# Patient Record
Sex: Female | Born: 1960 | Race: White | Hispanic: No | Marital: Married | Smoking: Never smoker
Health system: Southern US, Community
[De-identification: ages and names within clinical notes are randomized; demographics above are authoritative.]

## PROBLEM LIST (undated history)

## (undated) DIAGNOSIS — N958 Other specified menopausal and perimenopausal disorders: Secondary | ICD-10-CM

## (undated) DIAGNOSIS — E119 Type 2 diabetes mellitus without complications: Secondary | ICD-10-CM

## (undated) DIAGNOSIS — I10 Essential (primary) hypertension: Secondary | ICD-10-CM

## (undated) DIAGNOSIS — E785 Hyperlipidemia, unspecified: Secondary | ICD-10-CM

## (undated) DIAGNOSIS — E669 Obesity, unspecified: Secondary | ICD-10-CM

## (undated) DIAGNOSIS — F32A Depression, unspecified: Secondary | ICD-10-CM

## (undated) DIAGNOSIS — J45909 Unspecified asthma, uncomplicated: Secondary | ICD-10-CM

## (undated) DIAGNOSIS — F329 Major depressive disorder, single episode, unspecified: Secondary | ICD-10-CM

## (undated) HISTORY — DX: Hyperlipidemia, unspecified: E78.5

## (undated) HISTORY — DX: Other specified menopausal and perimenopausal disorders: N95.8

## (undated) HISTORY — PX: CHOLECYSTECTOMY: SHX55

## (undated) HISTORY — DX: Obesity, unspecified: E66.9

## (undated) HISTORY — DX: Essential (primary) hypertension: I10

---

## 2003-08-13 ENCOUNTER — Other Ambulatory Visit: Admission: RE | Admit: 2003-08-13 | Discharge: 2003-08-13 | Payer: Self-pay | Admitting: Family Medicine

## 2004-08-13 ENCOUNTER — Other Ambulatory Visit: Admission: RE | Admit: 2004-08-13 | Discharge: 2004-08-13 | Payer: Self-pay | Admitting: Family Medicine

## 2004-09-14 ENCOUNTER — Ambulatory Visit: Payer: Self-pay | Admitting: Family Medicine

## 2004-10-06 ENCOUNTER — Ambulatory Visit: Payer: Self-pay | Admitting: Family Medicine

## 2007-02-21 ENCOUNTER — Ambulatory Visit: Payer: Self-pay | Admitting: Internal Medicine

## 2007-03-17 ENCOUNTER — Ambulatory Visit: Payer: Self-pay | Admitting: Gastroenterology

## 2008-03-07 ENCOUNTER — Ambulatory Visit: Payer: Self-pay | Admitting: Internal Medicine

## 2009-03-11 ENCOUNTER — Ambulatory Visit: Payer: Self-pay | Admitting: Internal Medicine

## 2010-05-07 ENCOUNTER — Ambulatory Visit: Payer: Self-pay | Admitting: Internal Medicine

## 2010-06-10 ENCOUNTER — Ambulatory Visit: Payer: Self-pay | Admitting: Internal Medicine

## 2010-06-18 ENCOUNTER — Ambulatory Visit: Payer: Self-pay | Admitting: Internal Medicine

## 2010-07-18 ENCOUNTER — Ambulatory Visit: Payer: Self-pay | Admitting: Internal Medicine

## 2011-05-25 ENCOUNTER — Ambulatory Visit: Payer: Self-pay | Admitting: Internal Medicine

## 2011-07-31 ENCOUNTER — Other Ambulatory Visit: Payer: Self-pay | Admitting: Internal Medicine

## 2011-08-01 ENCOUNTER — Other Ambulatory Visit: Payer: Self-pay | Admitting: Internal Medicine

## 2011-09-08 ENCOUNTER — Other Ambulatory Visit: Payer: Self-pay | Admitting: Internal Medicine

## 2011-09-08 MED ORDER — SERTRALINE HCL 25 MG PO TABS: 25.0000 mg | ORAL_TABLET | Freq: Every day | ORAL | Status: DC

## 2012-02-29 ENCOUNTER — Encounter: Payer: Self-pay | Admitting: Internal Medicine

## 2012-03-14 ENCOUNTER — Other Ambulatory Visit: Payer: Self-pay | Admitting: Internal Medicine

## 2012-03-14 MED ORDER — SERTRALINE HCL 25 MG PO TABS: 25.0000 mg | ORAL_TABLET | Freq: Every day | ORAL | Status: DC

## 2012-08-14 ENCOUNTER — Other Ambulatory Visit: Payer: Self-pay | Admitting: Internal Medicine

## 2012-08-16 MED ORDER — SERTRALINE HCL 25 MG PO TABS: 25.0000 mg | ORAL_TABLET | Freq: Every day | ORAL | Status: DC

## 2012-08-16 NOTE — Addendum Note (Signed)
Addended by: Sherlene Shams on: 08/16/2012 06:10 PM   Modules accepted: Orders

## 2012-08-16 NOTE — Telephone Encounter (Signed)
2nd request

## 2012-08-17 ENCOUNTER — Other Ambulatory Visit: Payer: Self-pay

## 2012-09-26 ENCOUNTER — Encounter: Payer: Self-pay | Admitting: Internal Medicine

## 2012-10-17 ENCOUNTER — Other Ambulatory Visit: Payer: Self-pay | Admitting: Internal Medicine

## 2012-11-01 ENCOUNTER — Encounter: Payer: Self-pay | Admitting: Internal Medicine

## 2012-11-03 ENCOUNTER — Encounter: Payer: Self-pay | Admitting: Internal Medicine

## 2012-11-03 ENCOUNTER — Ambulatory Visit (INDEPENDENT_AMBULATORY_CARE_PROVIDER_SITE_OTHER): Payer: BC Managed Care – PPO | Admitting: Internal Medicine

## 2012-11-03 ENCOUNTER — Other Ambulatory Visit (HOSPITAL_COMMUNITY)
Admission: RE | Admit: 2012-11-03 | Discharge: 2012-11-03 | Disposition: A | Discharge status: Home / Self Care | Payer: BC Managed Care – PPO | Source: Ambulatory Visit | Attending: Internal Medicine | Admitting: Internal Medicine

## 2012-11-03 VITALS — BP 108/70 | HR 68 | Temp 98.2°F | Ht 67.0 in | Wt 230.0 lb

## 2012-11-03 DIAGNOSIS — F32A Depression, unspecified: Secondary | ICD-10-CM

## 2012-11-03 DIAGNOSIS — Z0000 Encounter for general adult medical examination without abnormal findings: Secondary | ICD-10-CM

## 2012-11-03 DIAGNOSIS — F329 Major depressive disorder, single episode, unspecified: Secondary | ICD-10-CM

## 2012-11-03 DIAGNOSIS — E669 Obesity, unspecified: Secondary | ICD-10-CM

## 2012-11-03 DIAGNOSIS — I10 Essential (primary) hypertension: Secondary | ICD-10-CM

## 2012-11-03 DIAGNOSIS — Z1151 Encounter for screening for human papillomavirus (HPV): Secondary | ICD-10-CM | POA: Insufficient documentation

## 2012-11-03 DIAGNOSIS — Z01419 Encounter for gynecological examination (general) (routine) without abnormal findings: Secondary | ICD-10-CM | POA: Insufficient documentation

## 2012-11-03 MED ORDER — SERTRALINE HCL 25 MG PO TABS: 25.0000 mg | ORAL_TABLET | Freq: Every day | ORAL | Status: DC

## 2012-11-03 MED ORDER — VALSARTAN-HYDROCHLOROTHIAZIDE 80-12.5 MG PO TABS: 1.0000 | ORAL_TABLET | Freq: Every day | ORAL | Status: DC

## 2012-11-03 NOTE — Addendum Note (Signed)
Addended by: Montine Circle D on: 11/03/2012 10:06 AM   Modules accepted: Orders

## 2012-11-03 NOTE — Assessment & Plan Note (Signed)
Symptoms well controlled with Sertraline. Will continue. 

## 2012-11-03 NOTE — Assessment & Plan Note (Addendum)
General medical exam normal today including breast and pelvic exam. PAP is pending. Will request recent lab reports from [facility]. Encouraged healthy diet and regular physical activity as described below. Flu vaccine declined. Follow up 6 months and prn.

## 2012-11-03 NOTE — Assessment & Plan Note (Addendum)
Body mass index is 36.02 kg/(m^2).  Encouraged keeping a food diary and setting goal of caloric intake <1500 per day. Encouraged vigorous exercise such as brisk walking 3 times per week. Follow up 6 months and prn.Marland Kitchen

## 2012-11-03 NOTE — Assessment & Plan Note (Signed)
BP well controlled on current medications. Occasionally having some symptomatic low BPs. Will try decreasing Diovan-HCTZ to 80-12.5mg  daily. Pt will monitor BP and call if any elevated BP>140/90. Follow up 6 months and prn. Will request recent renal function from [facility].

## 2012-11-03 NOTE — Progress Notes (Signed)
Subjective:    Patient ID: Kathleen Olsen, female    DOB: 05-Mar-1961, 52 y.o.   MRN: [id number]  HPI 51YO female with h/o obesity, hypertension, depression presents for annual exam and follow up. Has been lost to follow up since 2012.  Reports she is feeling well generally.  Concerned about weight. Starting a wellness program for weight loss at [facility] Notes mild dyspnea with exertion such as walking, but denies chest pain or diaphoresis.    In regards to BP, notes that BP has been running low, typically 100s/50s. Has occasional lightheadedness. Questions if her medication dose may be decreased.  Had labs performed by NP at her work in 07/2012, which she reports were normal. Declines flu vaccine. Due for PAP and mammogram.  Outpatient Encounter Prescriptions as of 11/03/2012  Medication Sig Dispense Refill  . sertraline (ZOLOFT) 25 MG tablet Take 1 tablet (25 mg total) by mouth daily.  90 tablet  4  . valsartan-hydrochlorothiazide (DIOVAN HCT) 80-12.5 MG per tablet Take 1 tablet by mouth daily.  90 tablet  4   BP 108/70  Pulse 68  Temp 98.2 F (36.8 C) (Oral)  Ht 5\' 7"  (1.702 m)  Wt 230 lb (104.327 kg)  BMI 36.02 kg/m2  Review of Systems  Constitutional: Negative for fever, chills, appetite change, fatigue and unexpected weight change.  HENT: Negative for ear pain, congestion, sore throat, trouble swallowing, neck pain, voice change and sinus pressure.   Eyes: Negative for visual disturbance.  Respiratory: Positive for shortness of breath. Negative for cough, wheezing and stridor.   Cardiovascular: Negative for chest pain, palpitations and leg swelling.  Gastrointestinal: Negative for nausea, vomiting, abdominal pain, diarrhea, constipation, blood in stool, abdominal distention and anal bleeding.  Genitourinary: Negative for dysuria and flank pain.  Musculoskeletal: Negative for myalgias, arthralgias and gait problem.  Skin: Negative for color change and rash.  Neurological: Negative  for dizziness and headaches.  Hematological: Negative for adenopathy. Does not bruise/bleed easily.  Psychiatric/Behavioral: Negative for suicidal ideas, sleep disturbance and dysphoric mood. The patient is not nervous/anxious.        Objective:   Physical Exam  Constitutional: She is oriented to person, place, and time. She appears well-developed and well-nourished. No distress.  HENT:  Head: Normocephalic and atraumatic.  Right Ear: External ear normal.  Left Ear: External ear normal.  Nose: Nose normal.  Mouth/Throat: Oropharynx is clear and moist. No oropharyngeal exudate.  Eyes: Conjunctivae normal are normal. Pupils are equal, round, and reactive to light. Right eye exhibits no discharge. Left eye exhibits no discharge. No scleral icterus.  Neck: Normal range of motion. Neck supple. No tracheal deviation present. No thyromegaly present.  Cardiovascular: Normal rate, regular rhythm, normal heart sounds and intact distal pulses.  Exam reveals no gallop and no friction rub.   No murmur heard. Pulmonary/Chest: Effort normal and breath sounds normal. No respiratory distress. She has no wheezes. She has no rales. She exhibits no tenderness.  Abdominal: Soft. Bowel sounds are normal. She exhibits no distension and no mass. There is no tenderness. There is no rebound and no guarding.  Genitourinary: Rectum normal and uterus normal. No breast swelling, tenderness, discharge or bleeding. Pelvic exam was performed with patient supine. There is no rash, tenderness or lesion on the right labia. There is no rash, tenderness or lesion on the left labia. Uterus is not enlarged and not tender. Cervix exhibits no motion tenderness, no discharge and no friability. Right adnexum displays no mass, no tenderness  and no fullness. Left adnexum displays no mass, no tenderness and no fullness. There is erythema (mild consistent with atrophic vaginitis) around the vagina. No tenderness around the vagina. No vaginal  discharge found.  Musculoskeletal: Normal range of motion. She exhibits no edema and no tenderness.  Lymphadenopathy:    She has no cervical adenopathy.  Neurological: She is alert and oriented to person, place, and time. No cranial nerve deficit. She exhibits normal muscle tone. Coordination normal.  Skin: Skin is warm and dry. No rash noted. She is not diaphoretic. No erythema. No pallor.  Psychiatric: She has a normal mood and affect. Her behavior is normal. Judgment and thought content normal.          Assessment & Plan:

## 2012-11-09 ENCOUNTER — Encounter: Payer: Self-pay | Admitting: *Deleted

## 2012-11-10 LAB — HM PAP SMEAR: HM PAP: NEGATIVE

## 2012-11-22 ENCOUNTER — Telehealth: Payer: Self-pay | Admitting: Internal Medicine

## 2012-11-22 NOTE — Telephone Encounter (Signed)
Labs show that cholesterol is slightly high with total cholesterol 223 and LDL 150. Vit D is slightly low at 18.  I would recommend taking Vit D 2000units daily and recheck Vit d in 3 months.  I would also recommend diet low in saturated fat and high in fiber.  Also recommend goal of physical activity at least 3 times per week.

## 2012-11-23 NOTE — Telephone Encounter (Signed)
Patient notified of lab results and doctors recommendation

## 2012-11-23 NOTE — Telephone Encounter (Signed)
Left message on answering machine to call back.

## 2013-01-06 ENCOUNTER — Other Ambulatory Visit: Payer: Self-pay | Admitting: Internal Medicine

## 2013-02-06 ENCOUNTER — Encounter: Payer: Self-pay | Admitting: Internal Medicine

## 2013-04-02 ENCOUNTER — Other Ambulatory Visit: Payer: Self-pay | Admitting: Internal Medicine

## 2013-04-02 DIAGNOSIS — I10 Essential (primary) hypertension: Secondary | ICD-10-CM

## 2013-04-02 NOTE — Telephone Encounter (Signed)
Left message about dosage.

## 2013-04-03 MED ORDER — VALSARTAN-HYDROCHLOROTHIAZIDE 80-12.5 MG PO TABS: 1.0000 | ORAL_TABLET | Freq: Every day | ORAL | Status: DC

## 2013-04-03 NOTE — Telephone Encounter (Signed)
11/03/12 office visit, Diovan HCT dose changed to 80-12.5 mg, pt has been using this dose. Denied refill for Diovan HCT 160-25 mg and ok refill Diovan 80-12.5 mg sent to pharmacy.

## 2013-04-03 NOTE — Telephone Encounter (Signed)
Left message for pt to return my call.

## 2013-05-07 ENCOUNTER — Ambulatory Visit: Payer: BC Managed Care – PPO | Admitting: Internal Medicine

## 2013-05-09 ENCOUNTER — Telehealth: Payer: Self-pay | Admitting: Internal Medicine

## 2013-05-09 NOTE — Telephone Encounter (Signed)
Patient asking Kathleen Olsen to schedule her mammogram at [facility].  Advised pt she can call them directly.  Pt prefers Kathleen Olsen to make the appt.  Pt is available early mornings (before 8 am if possible) and any day of the week.

## 2013-05-11 ENCOUNTER — Ambulatory Visit: Payer: BC Managed Care – PPO | Admitting: Internal Medicine

## 2013-05-23 ENCOUNTER — Ambulatory Visit: Payer: Self-pay | Admitting: Internal Medicine

## 2013-06-01 ENCOUNTER — Encounter: Payer: Self-pay | Admitting: Internal Medicine

## 2013-09-29 ENCOUNTER — Other Ambulatory Visit: Payer: Self-pay | Admitting: Internal Medicine

## 2013-09-29 NOTE — Telephone Encounter (Signed)
Okay to refill? Last seen in January 

## 2013-10-12 ENCOUNTER — Other Ambulatory Visit: Payer: Self-pay | Admitting: Internal Medicine

## 2014-01-16 ENCOUNTER — Other Ambulatory Visit: Payer: Self-pay | Admitting: Internal Medicine

## 2014-02-09 ENCOUNTER — Other Ambulatory Visit: Payer: Self-pay | Admitting: Internal Medicine

## 2014-02-10 NOTE — Telephone Encounter (Signed)
Okay to refill? Last seen in January 2014

## 2014-02-11 NOTE — Telephone Encounter (Signed)
May refill x 1 month then needs to be seen.

## 2014-03-11 ENCOUNTER — Other Ambulatory Visit: Payer: Self-pay | Admitting: Internal Medicine

## 2014-04-06 ENCOUNTER — Other Ambulatory Visit: Payer: Self-pay | Admitting: Internal Medicine

## 2014-04-19 ENCOUNTER — Other Ambulatory Visit: Payer: Self-pay | Admitting: Internal Medicine

## 2014-04-22 NOTE — Telephone Encounter (Signed)
Last refill 4.715, last OV 1.17.14, future OV 8.24.15.  Please advise refill.

## 2014-04-22 NOTE — Telephone Encounter (Signed)
May refill x 1 month only.

## 2014-05-24 ENCOUNTER — Other Ambulatory Visit: Payer: Self-pay | Admitting: Internal Medicine

## 2014-05-27 LAB — HEPATIC FUNCTION PANEL
ALT: 21 U/L (ref 7–35)
AST: 17 U/L (ref 13–35)
Alkaline Phosphatase: 104 U/L (ref 25–125)
Bilirubin, Total: 0.4 mg/dL

## 2014-05-27 LAB — BASIC METABOLIC PANEL
BUN: 13 mg/dL (ref 4–21)
CREATININE: 0.8 mg/dL (ref 0.5–1.1)
GLUCOSE: 104 mg/dL
Potassium: 4.5 mmol/L (ref 3.4–5.3)
Sodium: 143 mmol/L (ref 137–147)

## 2014-05-27 LAB — TSH: TSH: 1.84 u[IU]/mL (ref 0.41–5.90)

## 2014-05-27 LAB — CBC AND DIFFERENTIAL
HCT: 41 % (ref 36–46)
Hemoglobin: 13.7 g/dL (ref 12.0–16.0)
Neutrophils Absolute: 3 /uL
PLATELETS: 375 10*3/uL (ref 150–399)
WBC: 6.2 10*3/mL

## 2014-05-27 LAB — LIPID PANEL
Cholesterol: 226 mg/dL — AB (ref 0–200)
HDL: 55 mg/dL (ref 35–70)
LDL CALC: 150 mg/dL
LDl/HDL Ratio: 4.1
TRIGLYCERIDES: 104 mg/dL (ref 40–160)

## 2014-06-10 ENCOUNTER — Encounter (INDEPENDENT_AMBULATORY_CARE_PROVIDER_SITE_OTHER): Payer: Self-pay

## 2014-06-10 ENCOUNTER — Ambulatory Visit (INDEPENDENT_AMBULATORY_CARE_PROVIDER_SITE_OTHER): Payer: BC Managed Care – PPO | Admitting: Internal Medicine

## 2014-06-10 ENCOUNTER — Encounter: Payer: Self-pay | Admitting: Internal Medicine

## 2014-06-10 VITALS — BP 120/80 | HR 86 | Temp 98.1°F | Ht 66.5 in | Wt 231.8 lb

## 2014-06-10 DIAGNOSIS — Z8041 Family history of malignant neoplasm of ovary: Secondary | ICD-10-CM | POA: Insufficient documentation

## 2014-06-10 DIAGNOSIS — F32A Depression, unspecified: Secondary | ICD-10-CM

## 2014-06-10 DIAGNOSIS — Z0000 Encounter for general adult medical examination without abnormal findings: Secondary | ICD-10-CM

## 2014-06-10 DIAGNOSIS — Z1211 Encounter for screening for malignant neoplasm of colon: Secondary | ICD-10-CM | POA: Insufficient documentation

## 2014-06-10 DIAGNOSIS — F3289 Other specified depressive episodes: Secondary | ICD-10-CM

## 2014-06-10 DIAGNOSIS — E669 Obesity, unspecified: Secondary | ICD-10-CM

## 2014-06-10 DIAGNOSIS — I10 Essential (primary) hypertension: Secondary | ICD-10-CM

## 2014-06-10 DIAGNOSIS — Z1239 Encounter for other screening for malignant neoplasm of breast: Secondary | ICD-10-CM

## 2014-06-10 DIAGNOSIS — F329 Major depressive disorder, single episode, unspecified: Secondary | ICD-10-CM

## 2014-06-10 DIAGNOSIS — R1011 Right upper quadrant pain: Secondary | ICD-10-CM

## 2014-06-10 MED ORDER — SERTRALINE HCL 50 MG PO TABS: 50.0000 mg | ORAL_TABLET | Freq: Every day | ORAL | Status: DC

## 2014-06-10 MED ORDER — VALSARTAN-HYDROCHLOROTHIAZIDE 80-12.5 MG PO TABS: 1.0000 | ORAL_TABLET | Freq: Every day | ORAL | Status: DC

## 2014-06-10 NOTE — Patient Instructions (Addendum)
Start Vit D 2000units per day.  Health Maintenance Adopting a healthy lifestyle and getting preventive care can go a long way to promote health and wellness. Talk with your health care provider about what schedule of regular examinations is right for you. This is a good chance for you to check in with your provider about disease prevention and staying healthy. In between checkups, there are plenty of things you can do on your own. Experts have done a lot of research about which lifestyle changes and preventive measures are most likely to keep you healthy. Ask your health care provider for more information. WEIGHT AND DIET  Eat a healthy diet  Be sure to include plenty of vegetables, fruits, low-fat dairy products, and lean protein.  Do not eat a lot of foods high in solid fats, added sugars, or salt.  Get regular exercise. This is one of the most important things you can do for your health.  Most adults should exercise for at least 150 minutes each week. The exercise should increase your heart rate and make you sweat (moderate-intensity exercise).  Most adults should also do strengthening exercises at least twice a week. This is in addition to the moderate-intensity exercise.  Maintain a healthy weight  Body mass index (BMI) is a measurement that can be used to identify possible weight problems. It estimates body fat based on height and weight. Your health care provider can help determine your BMI and help you achieve or maintain a healthy weight.  For females 34 years of age and older:   A BMI below 18.5 is considered underweight.  A BMI of 18.5 to 24.9 is normal.  A BMI of 25 to 29.9 is considered overweight.  A BMI of 30 and above is considered obese.  Watch levels of cholesterol and blood lipids  You should start having your blood tested for lipids and cholesterol at 55 years of age, then have this test every 5 years.  You may need to have your cholesterol levels checked more  often if:  Your lipid or cholesterol levels are high.  You are older than 55 years of age.  You are at high risk for heart disease.  CANCER SCREENING   Lung Cancer  Lung cancer screening is recommended for adults 32-73 years old who are at high risk for lung cancer because of a history of smoking.  A yearly low-dose CT scan of the lungs is recommended for people who:  Currently smoke.  Have quit within the past 15 years.  Have at least a 30-pack-year history of smoking. A pack year is smoking an average of one pack of cigarettes a day for 1 year.  Yearly screening should continue until it has been 15 years since you quit.  Yearly screening should stop if you develop a health problem that would prevent you from having lung cancer treatment.  Breast Cancer  Practice breast self-awareness. This means understanding how your breasts normally appear and feel.  It also means doing regular breast self-exams. Let your health care provider know about any changes, no matter how small.  If you are in your 20s or 30s, you should have a clinical breast exam (CBE) by a health care provider every 1-3 years as part of a regular health exam.  If you are 19 or older, have a CBE every year. Also consider having a breast X-ray (mammogram) every year.  If you have a family history of breast cancer, talk to your health care provider  about genetic screening.  If you are at high risk for breast cancer, talk to your health care provider about having an MRI and a mammogram every year.  Breast cancer gene (BRCA) assessment is recommended for women who have family members with BRCA-related cancers. BRCA-related cancers include:  Breast.  Ovarian.  Tubal.  Peritoneal cancers.  Results of the assessment will determine the need for genetic counseling and BRCA1 and BRCA2 testing. Cervical Cancer Routine pelvic examinations to screen for cervical cancer are no longer recommended for nonpregnant  women who are considered low risk for cancer of the pelvic organs (ovaries, uterus, and vagina) and who do not have symptoms. A pelvic examination may be necessary if you have symptoms including those associated with pelvic infections. Ask your health care provider if a screening pelvic exam is right for you.   The Pap test is the screening test for cervical cancer for women who are considered at risk.  If you had a hysterectomy for a problem that was not cancer or a condition that could lead to cancer, then you no longer need Pap tests.  If you are older than 65 years, and you have had normal Pap tests for the past 10 years, you no longer need to have Pap tests.  If you have had past treatment for cervical cancer or a condition that could lead to cancer, you need Pap tests and screening for cancer for at least 20 years after your treatment.  If you no longer get a Pap test, assess your risk factors if they change (such as having a new sexual partner). This can affect whether you should start being screened again.  Some women have medical problems that increase their chance of getting cervical cancer. If this is the case for you, your health care provider may recommend more frequent screening and Pap tests.  The human papillomavirus (HPV) test is another test that may be used for cervical cancer screening. The HPV test looks for the virus that can cause cell changes in the cervix. The cells collected during the Pap test can be tested for HPV.  The HPV test can be used to screen women 50 years of age and older. Getting tested for HPV can extend the interval between normal Pap tests from three to five years.  An HPV test also should be used to screen women of any age who have unclear Pap test results.  After 55 years of age, women should have HPV testing as often as Pap tests.  Colorectal Cancer  This type of cancer can be detected and often prevented.  Routine colorectal cancer screening  usually begins at 55 years of age and continues through 55 years of age.  Your health care provider may recommend screening at an earlier age if you have risk factors for colon cancer.  Your health care provider may also recommend using home test kits to check for hidden blood in the stool.  A small camera at the end of a tube can be used to examine your colon directly (sigmoidoscopy or colonoscopy). This is done to check for the earliest forms of colorectal cancer.  Routine screening usually begins at age 7.  Direct examination of the colon should be repeated every 5-10 years through 55 years of age. However, you may need to be screened more often if early forms of precancerous polyps or small growths are found. Skin Cancer  Check your skin from head to toe regularly.  Tell your health care provider  about any new moles or changes in moles, especially if there is a change in a mole's shape or color.  Also tell your health care provider if you have a mole that is larger than the size of a pencil eraser.  Always use sunscreen. Apply sunscreen liberally and repeatedly throughout the day.  Protect yourself by wearing long sleeves, pants, a wide-brimmed hat, and sunglasses whenever you are outside. HEART DISEASE, DIABETES, AND HIGH BLOOD PRESSURE   Have your blood pressure checked at least every 1-2 years. High blood pressure causes heart disease and increases the risk of stroke.  If you are between 42 years and 73 years old, ask your health care provider if you should take aspirin to prevent strokes.  Have regular diabetes screenings. This involves taking a blood sample to check your fasting blood sugar level.  If you are at a normal weight and have a low risk for diabetes, have this test once every three years after 55 years of age.  If you are overweight and have a high risk for diabetes, consider being tested at a younger age or more often. PREVENTING INFECTION  Hepatitis B  If  you have a higher risk for hepatitis B, you should be screened for this virus. You are considered at high risk for hepatitis B if:  You were born in a country where hepatitis B is common. Ask your health care provider which countries are considered high risk.  Your parents were born in a high-risk country, and you have not been immunized against hepatitis B (hepatitis B vaccine).  You have HIV or AIDS.  You use needles to inject street drugs.  You live with someone who has hepatitis B.  You have had sex with someone who has hepatitis B.  You get hemodialysis treatment.  You take certain medicines for conditions, including cancer, organ transplantation, and autoimmune conditions. Hepatitis C  Blood testing is recommended for:  Everyone born from [birth year] through 1967.  Anyone with known risk factors for hepatitis C. Sexually transmitted infections (STIs)  You should be screened for sexually transmitted infections (STIs) including gonorrhea and chlamydia if:  You are sexually active and are younger than 55 years of age.  You are older than 55 years of age and your health care provider tells you that you are at risk for this type of infection.  Your sexual activity has changed since you were last screened and you are at an increased risk for chlamydia or gonorrhea. Ask your health care provider if you are at risk.  If you do not have HIV, but are at risk, it may be recommended that you take a prescription medicine daily to prevent HIV infection. This is called pre-exposure prophylaxis (PrEP). You are considered at risk if:  You are sexually active and do not regularly use condoms or know the HIV status of your partner(s).  You take drugs by injection.  You are sexually active with a partner who has HIV. Talk with your health care provider about whether you are at high risk of being infected with HIV. If you choose to begin PrEP, you should first be tested for HIV. You should then be  tested every 3 months for as long as you are taking PrEP.  PREGNANCY   If you are premenopausal and you may become pregnant, ask your health care provider about preconception counseling.  If you may become pregnant, take 400 to 800 micrograms (mcg) of folic acid every day.  If you  want to prevent pregnancy, talk to your health care provider about birth control (contraception). OSTEOPOROSIS AND MENOPAUSE   Osteoporosis is a disease in which the bones lose minerals and strength with aging. This can result in serious bone fractures. Your risk for osteoporosis can be identified using a bone density scan.  If you are 29 years of age or older, or if you are at risk for osteoporosis and fractures, ask your health care provider if you should be screened.  Ask your health care provider whether you should take a calcium or vitamin D supplement to lower your risk for osteoporosis.  Menopause may have certain physical symptoms and risks.  Hormone replacement therapy may reduce some of these symptoms and risks. Talk to your health care provider about whether hormone replacement therapy is right for you.  HOME CARE INSTRUCTIONS   Schedule regular health, dental, and eye exams.  Stay current with your immunizations.   Do not use any tobacco products including cigarettes, chewing tobacco, or electronic cigarettes.  If you are pregnant, do not drink alcohol.  If you are breastfeeding, limit how much and how often you drink alcohol.  Limit alcohol intake to no more than 1 drink per day for nonpregnant women. One drink equals 12 ounces of beer, 5 ounces of wine, or 1 ounces of hard liquor.  Do not use street drugs.  Do not share needles.  Ask your health care provider for help if you need support or information about quitting drugs.  Tell your health care provider if you often feel depressed.  Tell your health care provider if you have ever been abused or do not feel safe at home. Document  Released: 04/19/2011 Document Revised: 02/18/2014 Document Reviewed: 09/05/2013 [facility] Patient Information 2015 [place], [place]. This information is not intended to replace advice given to you by your health care provider. Make sure you discuss any questions you have with your health care provider.

## 2014-06-10 NOTE — Assessment & Plan Note (Signed)
Symptoms recently worsening. Will increase Sertraline to 50mg  daily and follow up in 4 weeks.

## 2014-06-10 NOTE — Assessment & Plan Note (Signed)
Will set up screening colonoscopy. 

## 2014-06-10 NOTE — Assessment & Plan Note (Signed)
General medical exam normal today except as noted. Breast exam normal. PAP and pelvic deferred as normal PAP 2014, HPV neg. Will set up mammogram and screening pelvic US given family h/o ovarian cancer. Encouraged healthy diet and exercise with goal of weight loss. Pt declines flu vaccine.

## 2014-06-10 NOTE — Assessment & Plan Note (Signed)
Family h/o ovarian cancer and intermittent aching pelvic pain. Will set up [place] evaluation of ovaries.

## 2014-06-10 NOTE — Assessment & Plan Note (Signed)
BP Readings from Last 3 Encounters:  06/10/14 120/80  11/03/12 108/70   BP well controlled. Renal function normal. Continue Valsartan-HCTZ.

## 2014-06-10 NOTE — Assessment & Plan Note (Signed)
Wt Readings from Last 3 Encounters:  06/10/14 231 lb 12 oz (105.121 kg)  11/03/12 230 lb (104.327 kg)   Body mass index is 36.85 kg/(m^2). Encouraged healthy diet and exercise with goal of weight loss.

## 2014-06-10 NOTE — Progress Notes (Signed)
Pre visit review using our clinic review tool, if applicable. No additional management support is needed unless otherwise documented below in the visit note. 

## 2014-06-10 NOTE — Progress Notes (Signed)
Subjective:    Patient ID: Kathleen Olsen, female    DOB: [birth year], 53 y.o.   MRN: [id number]  HPI 53YO female presents for annual exam.  RUQ abdominal pain and nausea/vomiting periodically over the last year.Worse with fatty foods. More frequent over the last few months. Occasional diarrhea.  Also concerned as father's mother had ovarian cancer. Occasionally has some fleeting pressure or aching pain in her lower abdomen. No vaginal bleeding or discharge. Last PAP 2014 was normal, HPV neg.  Depression and anxiety have seemed poorly controlled on Sertraline 25mg  daily. She is not sleeping well at night. She would like to consider a higher dose of medication. She notes significant stress at work and at home. She does feel safe at home.  Review of Systems  Constitutional: Negative for fever, chills, appetite change, fatigue and unexpected weight change.  Eyes: Negative for visual disturbance.  Respiratory: Negative for cough and shortness of breath.   Cardiovascular: Negative for chest pain and leg swelling.  Gastrointestinal: Positive for nausea, vomiting, abdominal pain (right upper quadrant abdominal pain) and diarrhea. Negative for constipation.  Genitourinary: Positive for pelvic pain (occasional fleeting pain or pressure, none at present). Negative for dysuria, vaginal bleeding, vaginal discharge, genital sores, vaginal pain, menstrual problem and dyspareunia.  Skin: Negative for color change and rash.  Hematological: Negative for adenopathy. Does not bruise/bleed easily.  Psychiatric/Behavioral: Positive for sleep disturbance and dysphoric mood. The patient is nervous/anxious.        Objective:    BP 120/80  Pulse 86  Temp(Src) 98.1 F (36.7 C) (Oral)  Ht 5' 6.5" (1.689 m)  Wt 231 lb 12 oz (105.121 kg)  BMI 36.85 kg/m2  SpO2 97% Physical Exam  Constitutional: She is oriented to person, place, and time. She appears well-developed and well-nourished. No distress.  HENT:    Head: Normocephalic and atraumatic.  Right Ear: External ear normal.  Left Ear: External ear normal.  Nose: Nose normal.  Mouth/Throat: Oropharynx is clear and moist. No oropharyngeal exudate.  Eyes: Conjunctivae are normal. Pupils are equal, round, and reactive to light. Right eye exhibits no discharge. Left eye exhibits no discharge. No scleral icterus.  Neck: Normal range of motion. Neck supple. No tracheal deviation present. No thyromegaly present.  Cardiovascular: Normal rate, regular rhythm, normal heart sounds and intact distal pulses.  Exam reveals no gallop and no friction rub.   No murmur heard. Pulmonary/Chest: Effort normal and breath sounds normal. No accessory muscle usage. Not tachypneic. No respiratory distress. She has no decreased breath sounds. She has no wheezes. She has no rales. She exhibits no tenderness. Right breast exhibits no inverted nipple, no mass, no nipple discharge, no skin change and no tenderness. Left breast exhibits no inverted nipple, no mass, no nipple discharge, no skin change and no tenderness. Breasts are symmetrical.  Abdominal: Soft. Bowel sounds are normal. She exhibits no distension and no mass. There is tenderness (RUQ). There is no rebound and no guarding.  Musculoskeletal: Normal range of motion. She exhibits no edema and no tenderness.  Lymphadenopathy:    She has no cervical adenopathy.  Neurological: She is alert and oriented to person, place, and time. No cranial nerve deficit. She exhibits normal muscle tone. Coordination normal.  Skin: Skin is warm and dry. No rash noted. She is not diaphoretic. No erythema. No pallor.  Psychiatric: Her behavior is normal. Judgment and thought content normal. Her mood appears anxious. She exhibits a depressed mood.  Assessment & Plan:   Problem List Items Addressed This Visit     Unprioritized   Depression     Symptoms recently worsening. Will increase Sertraline to 50mg  daily and follow up  in 4 weeks.    Relevant Medications      sertraline (ZOLOFT) tablet   Family history of ovarian cancer     Family h/o ovarian cancer and intermittent aching pelvic pain. Will set up [place] evaluation of ovaries.    Relevant Orders      US Transvaginal Non-OB   Hypertension      BP Readings from Last 3 Encounters:  06/10/14 120/80  11/03/12 108/70   BP well controlled. Renal function normal. Continue Valsartan-HCTZ.    Relevant Medications      valsartan-hydrochlorothiazide (DIOVAN-HCT) 80-12.5 MG per tablet   Obesity      Wt Readings from Last 3 Encounters:  06/10/14 231 lb 12 oz (105.121 kg)  11/03/12 230 lb (104.327 kg)   Body mass index is 36.85 kg/(m^2). Encouraged healthy diet and exercise with goal of weight loss.    Routine general medical examination at a health care facility - Primary     General medical exam normal today except as noted. Breast exam normal. PAP and pelvic deferred as normal PAP 2014, HPV neg. Will set up mammogram and screening pelvic US given family h/o ovarian cancer. Encouraged healthy diet and exercise with goal of weight loss. Pt declines flu vaccine.    RUQ abdominal pain     RUQ abdominal pain, nausea most consistent with cholecystitis. Mild elevation of GGT on labs. Will check RUQ [place].    Relevant Orders      US Abdomen Limited RUQ   Screening for breast cancer     Mammogram ordered.    Relevant Orders      MM Digital Screening   Special screening for malignant neoplasms, colon     Will set up screening colonoscopy.    Relevant Orders      Ambulatory referral to Gastroenterology       Return in about 4 weeks (around 07/08/2014) for Recheck.

## 2014-06-10 NOTE — Assessment & Plan Note (Signed)
Mammogram ordered

## 2014-06-10 NOTE — Assessment & Plan Note (Signed)
RUQ abdominal pain, nausea most consistent with cholecystitis. Mild elevation of GGT on labs. Will check RUQ [place].

## 2014-06-11 ENCOUNTER — Telehealth: Payer: Self-pay | Admitting: Internal Medicine

## 2014-06-11 NOTE — Telephone Encounter (Signed)
Relevant patient education mailed to patient.  

## 2014-06-12 ENCOUNTER — Ambulatory Visit: Payer: Self-pay | Admitting: Internal Medicine

## 2014-06-13 ENCOUNTER — Telehealth: Payer: Self-pay | Admitting: Internal Medicine

## 2014-06-13 DIAGNOSIS — K8020 Calculus of gallbladder without cholecystitis without obstruction: Secondary | ICD-10-CM

## 2014-06-13 DIAGNOSIS — N888 Other specified noninflammatory disorders of cervix uteri: Secondary | ICD-10-CM

## 2014-06-13 NOTE — Telephone Encounter (Signed)
Ultrasound of the abdomen showed multiple gallstones and fatty liver. Given history of right abdominal pain, I would recommend referral to general surgery to discuss removing gallbladder.  Ultrasound of the pelvis showed complex cystic area in the cervix. I would recommend a referral to GYN to discuss further evaluation of this.

## 2014-06-13 NOTE — Telephone Encounter (Signed)
Sent my chart with results. 

## 2014-07-01 ENCOUNTER — Other Ambulatory Visit: Payer: Self-pay | Admitting: Internal Medicine

## 2014-07-02 ENCOUNTER — Encounter: Payer: Self-pay | Admitting: Internal Medicine

## 2014-07-04 ENCOUNTER — Encounter: Payer: Self-pay | Admitting: General Surgery

## 2014-07-09 ENCOUNTER — Ambulatory Visit: Payer: Self-pay | Admitting: Internal Medicine

## 2014-07-09 LAB — HM MAMMOGRAPHY: HM Mammogram: NEGATIVE

## 2014-07-10 ENCOUNTER — Encounter: Payer: Self-pay | Admitting: *Deleted

## 2014-07-15 ENCOUNTER — Ambulatory Visit: Payer: BC Managed Care – PPO | Admitting: Internal Medicine

## 2014-07-17 ENCOUNTER — Encounter: Payer: Self-pay | Admitting: General Surgery

## 2014-07-17 ENCOUNTER — Ambulatory Visit (INDEPENDENT_AMBULATORY_CARE_PROVIDER_SITE_OTHER): Payer: BC Managed Care – PPO | Admitting: General Surgery

## 2014-07-17 ENCOUNTER — Other Ambulatory Visit: Payer: Self-pay | Admitting: General Surgery

## 2014-07-17 VITALS — BP 152/80 | HR 88 | Resp 12 | Ht 66.5 in | Wt 232.0 lb

## 2014-07-17 DIAGNOSIS — K8010 Calculus of gallbladder with chronic cholecystitis without obstruction: Secondary | ICD-10-CM

## 2014-07-17 NOTE — Patient Instructions (Addendum)

## 2014-07-17 NOTE — Progress Notes (Signed)
Patient ID: Kathleen Olsen, female   DOB: June 03, 1961, 53 y.o.   MRN: [id number]  Chief Complaint  Patient presents with  . Abdominal Pain    gall bladder    HPI Kathleen Olsen is a 53 y.o. female.  Here today for evaluation of her gallbladder. Ultrasound completed on 06-12-14. She states she has been having gall bladder "attacks" since last year. Since April she has had maybe 10 attacks. She feels it is occuring more often with the last "attack" 06-23-14. The "attacks" are described as right upper quadrant pain, radiating to her back. Nausea with vomiting. Lasting 1-2 hours. She feels it may be associated with onions. She occasionally has diarrhea not not with every attack. The pain is not always after eating a meal.  She states her abdomen was sore for 2 days after having the ultrasound.  HPI  Past Medical History  Diagnosis Date  . Other specified menopausal and postmenopausal disorder   . Hypertension   . Obesity, unspecified   . Hyperlipidemia     Past Surgical History  Procedure Laterality Date  . Cesarean section  02-22-80    Family History  Problem Relation Age of Onset  . Diabetes Mother   . Heart disease Father   . Hypertension Father   . Cancer Sister     breast  . Cancer Paternal Grandmother     ovarian    Social History History  Substance Use Topics  . Smoking status: Never Smoker   . Smokeless tobacco: Not on file  . Alcohol Use: Yes     Comment: occasional wine or beer    No Known Allergies  Current Outpatient Prescriptions  Medication Sig Dispense Refill  . Cholecalciferol (VITAMIN D PO) Take by mouth as needed.      Marland Kitchen CINNAMON PO Take by mouth as needed.      . sertraline (ZOLOFT) 50 MG tablet Take 1 tablet (50 mg total) by mouth daily.  90 tablet  4  . valsartan-hydrochlorothiazide (DIOVAN-HCT) 80-12.5 MG per tablet Take 1 tablet by mouth daily.  90 tablet  3   No current facility-administered medications for this visit.    Review  of Systems Review of Systems  Constitutional: Negative.   Respiratory: Negative.   Cardiovascular: Negative.   Gastrointestinal: Positive for nausea, vomiting, abdominal pain and diarrhea.    Blood pressure 152/80, pulse 88, resp. rate 12, height 5' 6.5" (1.689 m), weight 232 lb (105.235 kg).  Physical Exam Physical Exam  Constitutional: She is oriented to person, place, and time. She appears well-developed and well-nourished.  Neck: Neck supple.  Cardiovascular: Normal rate, regular rhythm and normal heart sounds.   Pulses:      Dorsalis pedis pulses are 2+ on the right side, and 2+ on the left side.       Posterior tibial pulses are 2+ on the right side, and 2+ on the left side.  No lower leg edema.  Pulmonary/Chest: Effort normal and breath sounds normal.  Abdominal: Soft. Normal appearance and bowel sounds are normal. There is tenderness in the right upper quadrant.  Lymphadenopathy:    She has no cervical adenopathy.  Neurological: She is alert and oriented to person, place, and time.  Skin: Skin is warm and dry.    Data Reviewed Abdominal ultrasound dated June 12, 2014 showed multiple gallstones. No gallbladder wall thickening. Common bile duct 3.6 mm. Evidence of hepatic steatosis.  Laboratory studies from May 27, 2014 showed normal CBC, electrolytes and  liver function studies.  Assessment     Symptomatic cholelithiasis.     Plan    Role for elective cholecystectomy was discussed. Considering her multiple attacks over the past year, she is likely to obtain significant benefit from cholecystectomy.      Laparoscopic Cholecystectomy with Intraoperative Cholangiogram. The procedure, including it's potential risks and complications (including but not limited to infection, bleeding, injury to intra-abdominal organs or bile ducts, bile leak, poor cosmetic result, sepsis and death) were discussed with the patient in detail. Non-operative options, including their  inherent risks (acute calculous cholecystitis with possible choledocholithiasis or gallstone pancreatitis, with the risk of ascending cholangitis, sepsis, and death) were discussed as well. The patient expressed and understanding of what we discussed and wishes to proceed with laparoscopic cholecystectomy. The patient further understands that if it is technically not possible, or it is unsafe to proceed laparoscopically, that I will convert to an open cholecystectomy.  Patient's surgery has been scheduled for 07-24-14 at [facility].  PCP/Ref: Ronette Deter    Robert Bellow 07/17/2014, 9:55 PM

## 2014-07-22 ENCOUNTER — Ambulatory Visit: Payer: Self-pay | Admitting: Anesthesiology

## 2014-07-22 LAB — POTASSIUM: Potassium: 3.9 mmol/L (ref 3.5–5.1)

## 2014-07-24 ENCOUNTER — Ambulatory Visit: Payer: Self-pay | Admitting: General Surgery

## 2014-07-24 ENCOUNTER — Encounter: Payer: Self-pay | Admitting: General Surgery

## 2014-07-24 ENCOUNTER — Encounter: Payer: Self-pay | Admitting: *Deleted

## 2014-07-24 DIAGNOSIS — K8010 Calculus of gallbladder with chronic cholecystitis without obstruction: Secondary | ICD-10-CM

## 2014-07-25 ENCOUNTER — Encounter: Payer: Self-pay | Admitting: General Surgery

## 2014-07-26 LAB — PATHOLOGY REPORT

## 2014-07-29 ENCOUNTER — Encounter: Payer: Self-pay | Admitting: General Surgery

## 2014-07-31 ENCOUNTER — Encounter: Payer: Self-pay | Admitting: General Surgery

## 2014-07-31 ENCOUNTER — Ambulatory Visit (INDEPENDENT_AMBULATORY_CARE_PROVIDER_SITE_OTHER): Payer: Self-pay | Admitting: General Surgery

## 2014-07-31 VITALS — BP 124/76 | HR 74 | Resp 14 | Ht 67.0 in | Wt 228.0 lb

## 2014-07-31 DIAGNOSIS — K8010 Calculus of gallbladder with chronic cholecystitis without obstruction: Secondary | ICD-10-CM

## 2014-07-31 DIAGNOSIS — K529 Noninfective gastroenteritis and colitis, unspecified: Secondary | ICD-10-CM

## 2014-07-31 NOTE — Patient Instructions (Signed)
She may add a fiber supplement to her diet.

## 2014-07-31 NOTE — Progress Notes (Signed)
Patient ID: Kathleen Olsen, female   DOB: October 18, 1961, 53 y.o.   MRN: [id number]  Chief Complaint  Patient presents with  . Routine Post Op    HPI Kathleen Olsen is a 53 y.o. female.  Here today for her postoperative visit, laparoscopic cholecystectomy on 07-24-14. She states pain minimal, still having some diarrhea, "pudding" consistency. She states she has been to the bathroom 3 times this morning with 3 cups of coffee and no food. Total BM yesterday was about 5-6 times.   HPI  Past Medical History  Diagnosis Date  . Other specified menopausal and postmenopausal disorder   . Hypertension   . Obesity, unspecified   . Hyperlipidemia     Past Surgical History  Procedure Laterality Date  . Cesarean section  02-22-80    Family History  Problem Relation Age of Onset  . Diabetes Mother   . Heart disease Father   . Hypertension Father   . Cancer Sister     breast  . Cancer Paternal Grandmother     ovarian    Social History History  Substance Use Topics  . Smoking status: Never Smoker   . Smokeless tobacco: Never Used  . Alcohol Use: Yes     Comment: occasional wine or beer    No Known Allergies  Current Outpatient Prescriptions  Medication Sig Dispense Refill  . Cholecalciferol (VITAMIN D PO) Take by mouth as needed.      Marland Kitchen CINNAMON PO Take by mouth as needed.      . sertraline (ZOLOFT) 50 MG tablet Take 1 tablet (50 mg total) by mouth daily.  90 tablet  4  . valsartan-hydrochlorothiazide (DIOVAN-HCT) 80-12.5 MG per tablet Take 1 tablet by mouth daily.  90 tablet  3   No current facility-administered medications for this visit.    Review of Systems Review of Systems  Constitutional: Positive for appetite change.  Respiratory: Negative.   Cardiovascular: Negative.   Gastrointestinal: Positive for diarrhea.    Blood pressure 124/76, pulse 74, resp. rate 14, height 5\' 7"  (1.702 m), weight 228 lb (103.42 kg).  Physical Exam Physical Exam   Constitutional: She is oriented to person, place, and time. She appears well-developed and well-nourished.  Neck: Neck supple.  Cardiovascular: Normal rate, regular rhythm and normal heart sounds.   Pulmonary/Chest: Effort normal and breath sounds normal.  Abdominal: Soft. Normal appearance. There is no tenderness.  All port sites well healed.  Lymphadenopathy:    She has no cervical adenopathy.  Neurological: She is alert and oriented to person, place, and time.  Skin: Skin is warm and dry.    Data Reviewed Chronic cholecystitis and cholelithiasis. No evidence of malignancy.  Assessment    Postprandial diarrhea likely secondary to cholecystectomy.       Plan    The patient was reassured that the diarrhea should improve with time. She can make use of a fiber supplement if she desires, but she may do better backing off on her caffeine consumption in the short term. If she's not significantly and prove the next 2-3 week she was encouraged to call for reassessment.     She may add a fiber supplement to her diet. Return to work tomorrow. Follow up as needed.  PCP/Ref: Ronette Deter   Robert Bellow 08/01/2014, 6:46 AM

## 2014-08-01 DIAGNOSIS — K529 Noninfective gastroenteritis and colitis, unspecified: Secondary | ICD-10-CM | POA: Insufficient documentation

## 2014-08-01 DIAGNOSIS — K8010 Calculus of gallbladder with chronic cholecystitis without obstruction: Secondary | ICD-10-CM | POA: Insufficient documentation

## 2014-08-19 ENCOUNTER — Encounter: Payer: Self-pay | Admitting: General Surgery

## 2014-11-12 ENCOUNTER — Ambulatory Visit: Payer: Self-pay | Admitting: Obstetrics and Gynecology

## 2014-11-12 LAB — BASIC METABOLIC PANEL
Anion Gap: 7 (ref 7–16)
BUN: 10 mg/dL (ref 7–18)
CALCIUM: 9.1 mg/dL (ref 8.5–10.1)
CHLORIDE: 103 mmol/L (ref 98–107)
Co2: 29 mmol/L (ref 21–32)
Creatinine: 0.99 mg/dL (ref 0.60–1.30)
EGFR (African American): >60
EGFR (Non-African Amer.): >60
Glucose: 108 mg/dL — ABNORMAL HIGH (ref 65–99)
Osmolality: 277 (ref 275–301)
POTASSIUM: 3.8 mmol/L (ref 3.5–5.1)
Sodium: 139 mmol/L (ref 136–145)

## 2014-11-22 ENCOUNTER — Ambulatory Visit: Payer: Self-pay | Admitting: Obstetrics and Gynecology

## 2015-02-08 NOTE — Op Note (Signed)
PATIENT NAME:  [name], [name] MR#:  [id number] DATE OF BIRTH:  08-Jul-1961  DATE OF PROCEDURE:  07/24/2014  PREOPERATIVE DIAGNOSIS: Chronic cholecystitis and cholelithiasis.   POSTOPERATIVE DIAGNOSIS: Chronic cholecystitis and cholelithiasis.   OPERATIVE PROCEDURE: Laparoscopic cholecystectomy with intraoperative cholangiograms.   SURGEON: Hervey Ard, MD.   ANESTHESIA: General endotracheal under Dr. Benjamine Mola.   ESTIMATED BLOOD LOSS: 5 to 10 mL.   CLINICAL NOTE: This 54 year old woman has had episodic abdominal pain and recent ultrasound showed evidence of cholelithiasis. She was felt to be a candidate for elective cholecystectomy.   OPERATIVE NOTE: With the patient under adequate general endotracheal anesthesia, the abdomen was prepped with ChloraPrep and draped. In Trendelenburg position, a Veress needle was placed through a transumbilical incision. After assuring intra-abdominal location with the hanging drop test, the abdomen was insufflated with CO2 at 10 mmHg pressure. A 10 mm step port was expanded. Inspection showed no evidence of injury from initial port placement. The patient was placed in reverse Trendelenburg position and rolled to the left. An 11 mm XL port was placed under direct vision, followed by two 5 mm step ports in the right lateral abdominal wall. The patient was of generous size and increased Trendelenburg and rotation was used to better expose the neck of the gallbladder. The gallbladder was placed on cephalad traction after decompression with a needle catheter. The cystic artery was found to be crossing the cystic duct and this was doubly clipped and divided. The cystic duct was cleared and a Kumar clamp placed. Fluoroscopic cholangiograms were completed in multiple runs to a total volume of 32 mL of one-half strength Conray 60 to eventually complete visualization of the biliary tree was completed. Filling of the right and left hepatic ducts, common bile duct and flow into  the duodenum was noted. The catheter was removed. The cystic duct was doubly clipped and divided. The gallbladder was removed from the liver bed making use of hook cautery dissection. There was a small area of venous bleeding and this was controlled with the use of a small piece of Surgicel. This had resolved at the end of the procedure. The gallbladder was delivered through the umbilical port site. Multiple 1 cm stones were extracted. After re-establishing pneumoperitoneum, inspection showed good hemostasis in the right upper quadrant. This was irrigated with lactated Ringer's solution. The abdomen was then desufflated and ports removed under direct vision. Skin incisions were closed with 4-0 Vicryl subcuticular sutures. Benzoin, Steri-Strips, Telfa and Tegaderm dressing was then applied.   The patient tolerated the procedure well and was taken to the recovery room in stable condition.    ____________________________ Robert Bellow, MD jwb:hh D: 07/24/2014 21:33:36 ET T: 07/25/2014 00:24:37 ET JOB#: [id number]  cc: Robert Bellow, MD, <Dictator> Eduard Clos. Gilford Rile, MD JEFFREY Amedeo Kinsman MD ELECTRONICALLY SIGNED 07/25/2014 10:29

## 2015-02-10 LAB — SURGICAL PATHOLOGY

## 2015-02-16 NOTE — Op Note (Signed)
PATIENT NAME:  Kathleen, Olsen MR#:  [id number] DATE OF BIRTH:  Jun 27, 1961  DATE OF PROCEDURE:  11/22/2014  PREOPERATIVE DIAGNOSES:  1.  Thickened endometrial stripe in a postmenopausal woman.  2.  Complex cervical cyst.  POSTOPERATIVE DIAGNOSES:   1.  Thickened endometrial stripe in a postmenopausal woman.  2.  Complex cervical cyst.  PROCEDURE:  Fractional dilatation and curettage with hysteroscopy.   SPECIMENS: Endometrial curettings and endocervical curettings.   SURGEON: Benjaman Kindler, MD   ANESTHESIA: General.   ESTIMATED BLOOD LOSS: Minimal.   OPERATIVE FLUIDS: 600 mL.   URINE OUTPUT: 25 mL.   COMPLICATIONS: None.   FINDINGS:  Small mobile uterus, measuring 7 cm with a normal-appearing cervical canal. The cervix measured 3.5 cm, normal atrophic endometrium with normal appearing bilateral tubal ostia and a small, firm, submucosal fibroid with less than 30% of the fibroid in the endometrial canal.   CONDITION: Stable to PACU.   INDICATION FOR PROCEDURE: [name] is a 54 year old G1, P1 who had imaging which found a complex endocervical cyst with thickened endometrial stripe of 6.88 mm and a benign endometrial biopsy.  She was taken to the Operating Room to evaluate her endometrial tissue and remove a cervical cyst if found.   PROCEDURE:  The patient was taken to the Operating Room, where she was identified by name and birthdate. She was placed under general anesthesia without complication.  She was then positioned in a dorsal lithotomy position and prepped and draped in the usual sterile fashion. A formal timeout procedure was performed with all team members present and in agreement.   The bladder was drained by straight catheter for 25 mL of clear urine.  A weighted speculum was placed in the vagina, and a Sims retractor was used to displace the bladder.  The anterior lip of the cervix was grasped with a single-tooth tenaculum and the cervix was examined.  No  ectocervical findings were noted.  There were no lesions or polyps noted at this place. The cervix was then serially dilated and the hysteroscope was introduced.  The cervicoscopy was performed with normal mucosal lining and no evidence of thickening of the cervical walls or of protruding polyps.  Evaluation of the uterus was also done noting a small, mostly intramural fibroid with approximately 30% of the fibroid occupying the submucosal space.  This fibroid was not large enough to remove.  A fractional dilatation and curettage was done and the specimen sent off to pathology.  All instruments were then removed from the uterus and vagina taking care to evaluate the cervix again on the way out.  The tenaculum sites were hemostatic.   The patient was taken to PACU in stable condition after general anesthesia was reversed.     ____________________________ Angelina Pih, MD beb:DT D: 11/22/2014 11:29:01 ET T: 11/22/2014 12:59:59 ET JOB#: [id number]  cc: Angelina Pih, MD, <Dictator> Angelina Pih MD ELECTRONICALLY SIGNED 12/01/2014 10:28

## 2015-07-02 ENCOUNTER — Other Ambulatory Visit: Payer: Self-pay | Admitting: Internal Medicine

## 2015-07-02 NOTE — Telephone Encounter (Signed)
Please advise refill as patient has not been seen since 06/10/14?

## 2015-07-02 NOTE — Telephone Encounter (Signed)
May refill 1 month only then needs follow up

## 2015-08-09 ENCOUNTER — Other Ambulatory Visit: Payer: Self-pay | Admitting: Internal Medicine

## 2015-08-12 LAB — BASIC METABOLIC PANEL
BUN: 9 mg/dL (ref 4–21)
Creatinine: 0.9 mg/dL (ref 0.5–1.1)
Potassium: 4.4 mmol/L (ref 3.4–5.3)
Sodium: 140 mmol/L (ref 137–147)

## 2015-08-12 LAB — CBC AND DIFFERENTIAL
HEMATOCRIT: 37 % (ref 36–46)
Hemoglobin: 12.1 g/dL (ref 12.0–16.0)
Neutrophils Absolute: 3 /uL
PLATELETS: 344 10*3/uL (ref 150–399)
WBC: 5.7 10^3/mL

## 2015-08-12 LAB — TSH: TSH: 1.85 u[IU]/mL (ref 0.41–5.90)

## 2015-08-12 LAB — HEPATIC FUNCTION PANEL
ALK PHOS: 93 U/L (ref 25–125)
ALT: 23 U/L (ref 7–35)
AST: 23 U/L (ref 13–35)
BILIRUBIN, TOTAL: 0.6 mg/dL

## 2015-08-12 LAB — HEMOGLOBIN A1C: Hgb A1c MFr Bld: 6 % (ref 4.0–6.0)

## 2015-08-12 LAB — LIPID PANEL
Cholesterol: 219 mg/dL — AB (ref 0–200)
HDL: 50 mg/dL (ref 35–70)
LDL CALC: 143 mg/dL
Triglycerides: 131 mg/dL (ref 40–160)

## 2015-08-14 ENCOUNTER — Encounter: Payer: Self-pay | Admitting: Internal Medicine

## 2015-09-08 ENCOUNTER — Ambulatory Visit (INDEPENDENT_AMBULATORY_CARE_PROVIDER_SITE_OTHER): Payer: BLUE CROSS/BLUE SHIELD | Admitting: Internal Medicine

## 2015-09-08 ENCOUNTER — Encounter: Payer: Self-pay | Admitting: Internal Medicine

## 2015-09-08 VITALS — BP 120/73 | HR 87 | Temp 98.1°F | Ht 66.5 in | Wt 243.2 lb

## 2015-09-08 DIAGNOSIS — I10 Essential (primary) hypertension: Secondary | ICD-10-CM | POA: Diagnosis not present

## 2015-09-08 DIAGNOSIS — Z1211 Encounter for screening for malignant neoplasm of colon: Secondary | ICD-10-CM | POA: Diagnosis not present

## 2015-09-08 DIAGNOSIS — F32A Depression, unspecified: Secondary | ICD-10-CM

## 2015-09-08 DIAGNOSIS — E669 Obesity, unspecified: Secondary | ICD-10-CM

## 2015-09-08 DIAGNOSIS — F329 Major depressive disorder, single episode, unspecified: Secondary | ICD-10-CM

## 2015-09-08 MED ORDER — LIRAGLUTIDE -WEIGHT MANAGEMENT 18 MG/3ML ~~LOC~~ SOPN: 0.6000 mg | PEN_INJECTOR | Freq: Every day | SUBCUTANEOUS | Status: DC

## 2015-09-08 MED ORDER — INSULIN PEN NEEDLE 33G X 5 MM MISC: 1.0000 "application " | Freq: Every day | Status: DC

## 2015-09-08 MED ORDER — VALSARTAN-HYDROCHLOROTHIAZIDE 80-12.5 MG PO TABS: 1.0000 | ORAL_TABLET | Freq: Every day | ORAL | Status: DC

## 2015-09-08 MED ORDER — SERTRALINE HCL 50 MG PO TABS: 50.0000 mg | ORAL_TABLET | Freq: Every day | ORAL | Status: DC

## 2015-09-08 NOTE — Assessment & Plan Note (Signed)
BP Readings from Last 3 Encounters:  09/08/15 120/73  07/31/14 124/76  07/17/14 152/80   BP well controlled. Continue current medication. Renal function normal.

## 2015-09-08 NOTE — Progress Notes (Signed)
Subjective:    Patient ID: Kathleen Olsen, female    DOB: 1961-09-15, 54 y.o.   MRN: [id number]  HPI  54YO female presents for follow up.  Last seen in 2015.  Obesity - Worried about weight. Would like to start DM. Working with nutritionist at [employer]. Starting to increase physical activity.  HTN - Compliant with medications. No CP, HA, palpitations.   Wt Readings from Last 3 Encounters:  09/08/15 243 lb 4 oz (110.337 kg)  07/31/14 228 lb (103.42 kg)  07/17/14 232 lb (105.235 kg)   BP Readings from Last 3 Encounters:  09/08/15 120/73  07/31/14 124/76  07/17/14 152/80    Past Medical History  Diagnosis Date  . Other specified menopausal and postmenopausal disorder   . Hypertension   . Obesity, unspecified   . Hyperlipidemia    Family History  Problem Relation Age of Onset  . Diabetes Mother   . Heart disease Father   . Hypertension Father   . Cancer Sister     breast  . Cancer Paternal Grandmother     ovarian   Past Surgical History  Procedure Laterality Date  . Cesarean section  02-22-80   Social History   Social History  . Marital Status: Married    Spouse Name: N/A  . Number of Children: N/A  . Years of Education: N/A   Social History Main Topics  . Smoking status: Never Smoker   . Smokeless tobacco: Never Used  . Alcohol Use: Yes     Comment: occasional wine or beer  . Drug Use: No  . Sexual Activity: Not Asked   Other Topics Concern  . None   Social History Narrative   Lives in [place].       Work - [employer]      Diet - regular diet, starting a weight loss challenge   Exercise - starting    Review of Systems  Constitutional: Negative for fever, chills, appetite change, fatigue and unexpected weight change.  Eyes: Negative for visual disturbance.  Respiratory: Negative for shortness of breath.   Cardiovascular: Negative for chest pain and leg swelling.  Gastrointestinal: Negative for nausea, vomiting, abdominal pain,  diarrhea and constipation.  Musculoskeletal: Negative for myalgias and arthralgias.  Skin: Negative for color change and rash.  Hematological: Negative for adenopathy. Does not bruise/bleed easily.  Psychiatric/Behavioral: Negative for suicidal ideas, sleep disturbance and dysphoric mood. The patient is not nervous/anxious.        Objective:    BP 120/73 mmHg  Pulse 87  Temp(Src) 98.1 F (36.7 C) (Oral)  Ht 5' 6.5" (1.689 m)  Wt 243 lb 4 oz (110.337 kg)  BMI 38.68 kg/m2  SpO2 98% Physical Exam  Constitutional: She is oriented to person, place, and time. She appears well-developed and well-nourished. No distress.  HENT:  Head: Normocephalic and atraumatic.  Right Ear: External ear normal.  Left Ear: External ear normal.  Nose: Nose normal.  Mouth/Throat: Oropharynx is clear and moist. No oropharyngeal exudate.  Eyes: Conjunctivae are normal. Pupils are equal, round, and reactive to light. Right eye exhibits no discharge. Left eye exhibits no discharge. No scleral icterus.  Neck: Normal range of motion. Neck supple. No tracheal deviation present. No thyromegaly present.  Cardiovascular: Normal rate, regular rhythm, normal heart sounds and intact distal pulses.  Exam reveals no gallop and no friction rub.   No murmur heard. Pulmonary/Chest: Effort normal and breath sounds normal. No respiratory distress. She has no wheezes. She  has no rales. She exhibits no tenderness.  Musculoskeletal: Normal range of motion. She exhibits no edema or tenderness.  Lymphadenopathy:    She has no cervical adenopathy.  Neurological: She is alert and oriented to person, place, and time. No cranial nerve deficit. She exhibits normal muscle tone. Coordination normal.  Skin: Skin is warm and dry. No rash noted. She is not diaphoretic. No erythema. No pallor.  Psychiatric: She has a normal mood and affect. Her behavior is normal. Judgment and thought content normal.          Assessment & Plan:    Problem List Items Addressed This Visit      Unprioritized   Depression    Symptoms well controlled on Sertraline. Will continue.      Relevant Medications   sertraline (ZOLOFT) 50 MG tablet   Hypertension - Primary    BP Readings from Last 3 Encounters:  09/08/15 120/73  07/31/14 124/76  07/17/14 152/80   BP well controlled. Continue current medication. Renal function normal.      Relevant Medications   valsartan-hydrochlorothiazide (DIOVAN-HCT) 80-12.5 MG tablet   Obesity    Wt Readings from Last 3 Encounters:  09/08/15 243 lb 4 oz (110.337 kg)  07/31/14 228 lb (103.42 kg)  07/17/14 232 lb (105.235 kg)   Body mass index is 38.68 kg/(m^2). Encouraged her to work with nutritionist at work on diet. Will start Saxenda to help with appetite. Discussed potential side effects of this medication. Follow up by email in 1 week and visit in 4 weeks.      Relevant Medications   Liraglutide -Weight Management (SAXENDA) 18 MG/3ML SOPN   Special screening for malignant neoplasms, colon    Referral placed for screening colonoscopy.      Relevant Orders   Ambulatory referral to Gastroenterology       Return in about 4 weeks (around 10/06/2015) for Recheck.

## 2015-09-08 NOTE — Assessment & Plan Note (Signed)
Referral placed for screening colonoscopy.

## 2015-09-08 NOTE — Assessment & Plan Note (Signed)
Wt Readings from Last 3 Encounters:  09/08/15 243 lb 4 oz (110.337 kg)  07/31/14 228 lb (103.42 kg)  07/17/14 232 lb (105.235 kg)   Body mass index is 38.68 kg/(m^2). Encouraged her to work with nutritionist at work on diet. Will start Saxenda to help with appetite. Discussed potential side effects of this medication. Follow up by email in 1 week and visit in 4 weeks.

## 2015-09-08 NOTE — Assessment & Plan Note (Signed)
Symptoms well controlled on Sertraline. Will continue.

## 2015-09-08 NOTE — Progress Notes (Signed)
Pre visit review using our clinic review tool, if applicable. No additional management support is needed unless otherwise documented below in the visit note. 

## 2015-09-08 NOTE — Patient Instructions (Signed)
Start Saxenda 0.6mg  injected daily.  [email or web address]  Follow up by email 2 weeks and visit in 4 weeks.

## 2015-12-05 ENCOUNTER — Telehealth: Payer: Self-pay

## 2015-12-05 NOTE — Telephone Encounter (Signed)
Started PA for Kathleen Olsen, pharmacy sending form to the office to complete.

## 2015-12-08 ENCOUNTER — Encounter: Payer: Self-pay | Admitting: Internal Medicine

## 2015-12-08 NOTE — Telephone Encounter (Signed)
Fax completed and placed in Providers quick sign folder.

## 2015-12-11 NOTE — Telephone Encounter (Signed)
PA denied due to insufficient trial or documentation of ineligibility to phentermine.

## 2015-12-11 NOTE — Telephone Encounter (Signed)
She has hypertension, which should be a contraindication to phentermine.

## 2015-12-12 NOTE — Telephone Encounter (Signed)
I need the original paperwork from the PA from [place] and I will attempt a appeal. thanks

## 2015-12-15 NOTE — Telephone Encounter (Signed)
Resubmitted PA for Saxenda with new information. Thanks

## 2015-12-18 ENCOUNTER — Encounter: Payer: Self-pay | Admitting: Internal Medicine

## 2016-01-02 ENCOUNTER — Other Ambulatory Visit: Payer: Self-pay | Admitting: Orthopedic Surgery

## 2016-01-02 DIAGNOSIS — M1711 Unilateral primary osteoarthritis, right knee: Secondary | ICD-10-CM

## 2016-01-02 DIAGNOSIS — M25561 Pain in right knee: Secondary | ICD-10-CM

## 2016-01-04 IMAGING — MG MM DIGITAL SCREENING BILAT W/ CAD
1 series · 5 of 5 positions shown · non-contrast
Comparison: Previous exam(s).

CLINICAL DATA: Screening.

EXAM:
DIGITAL SCREENING BILATERAL MAMMOGRAM WITH CAD

[R CC · right · 5 of 5 slices shown]
[im 1/5]
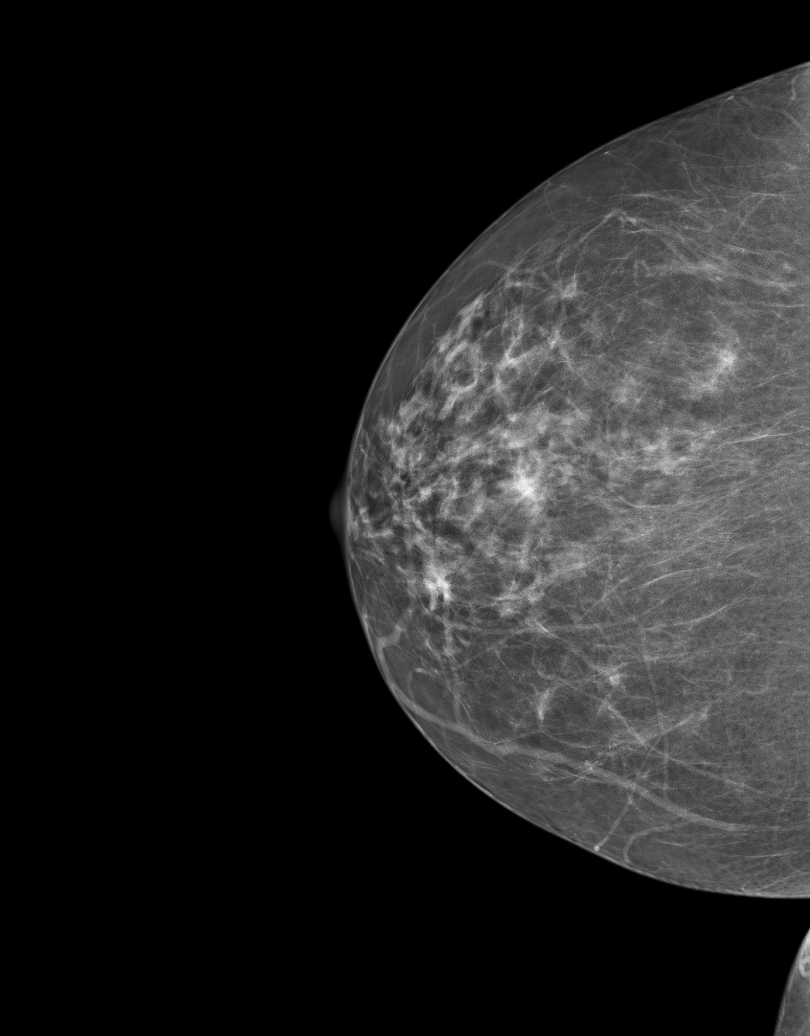
[im 2/5]
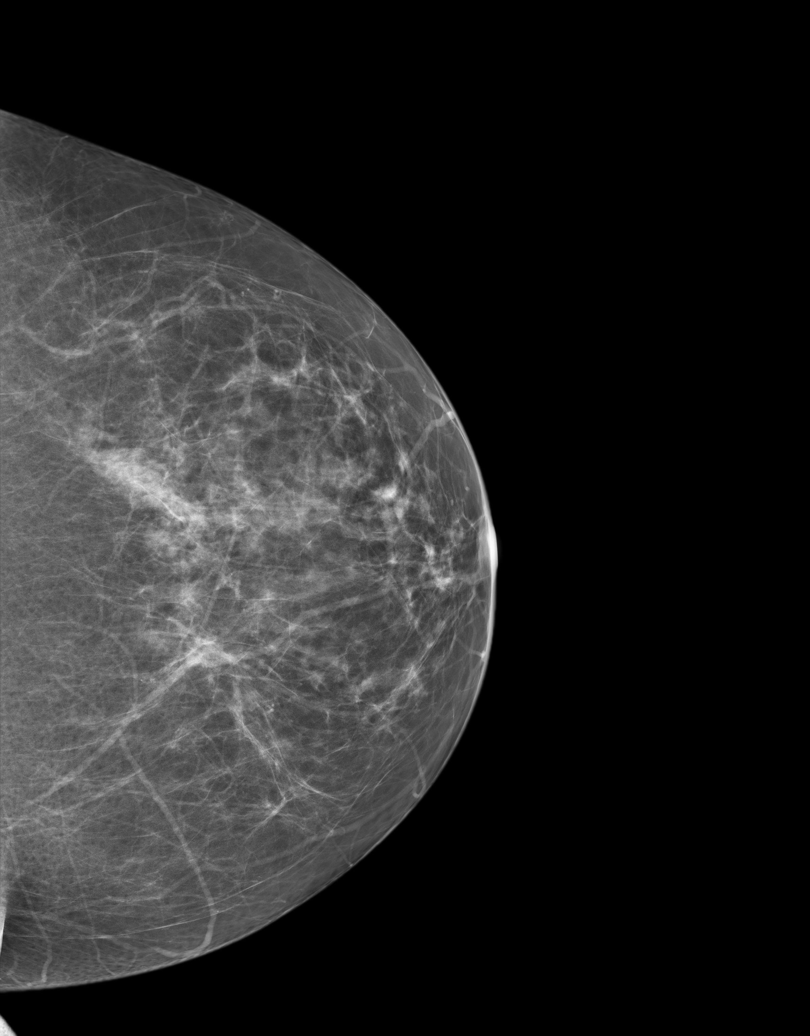
[im 3/5]
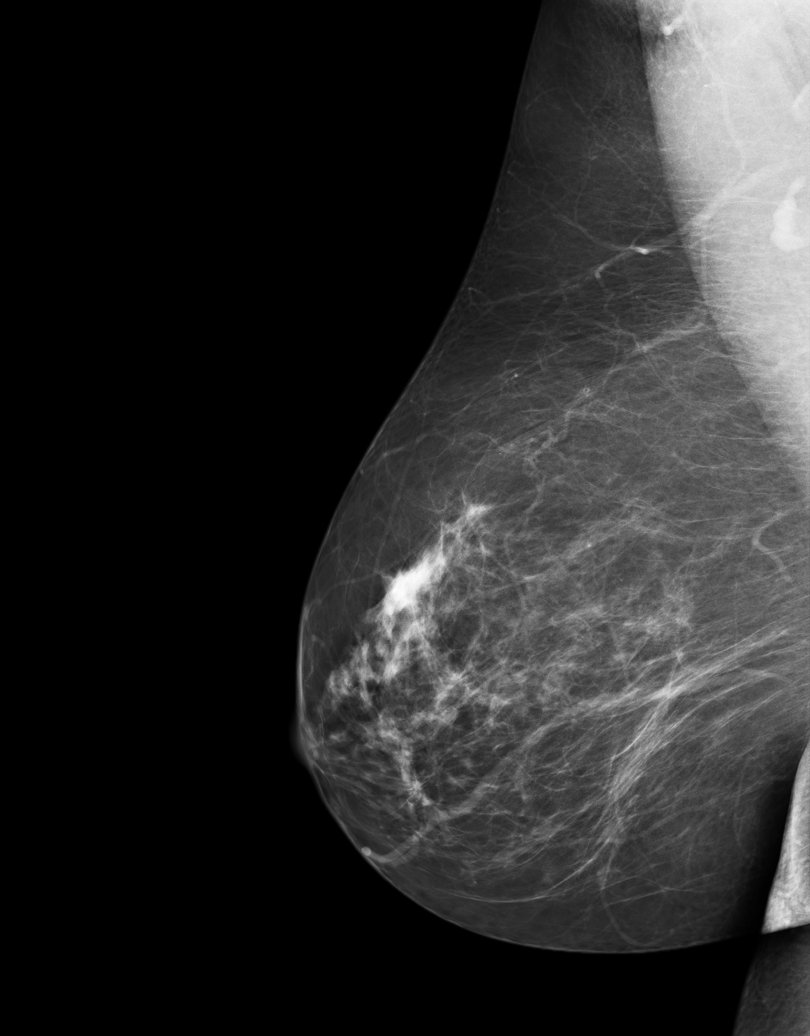
[im 4/5]
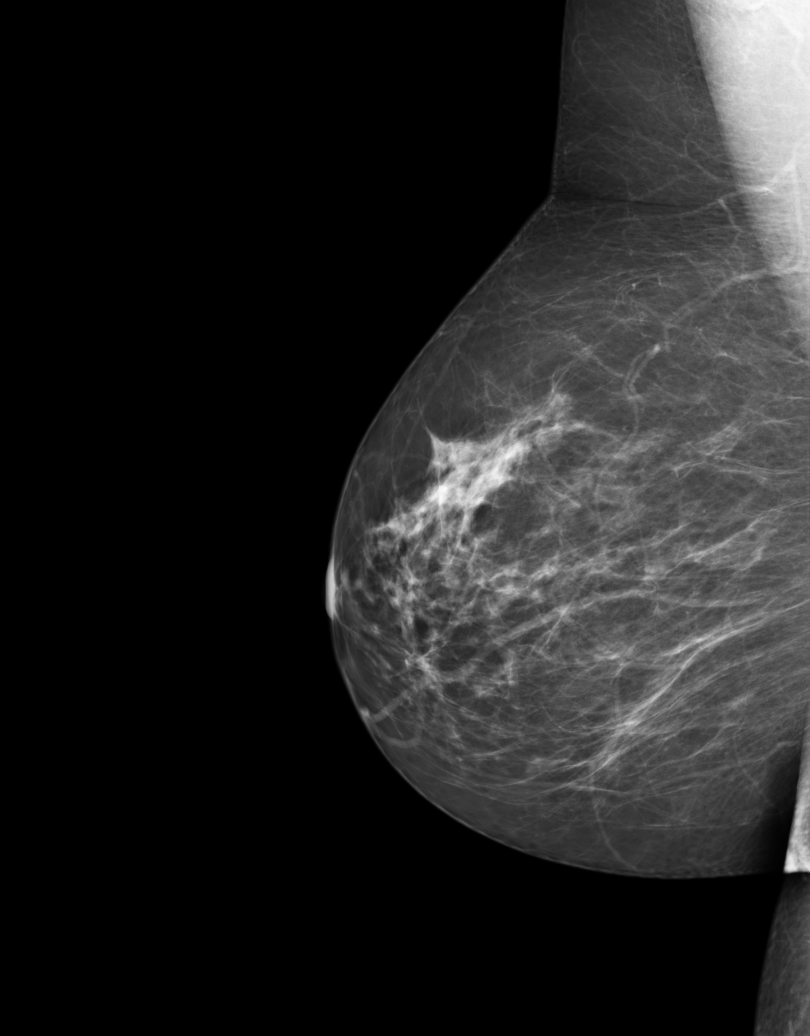
[im 5/5]
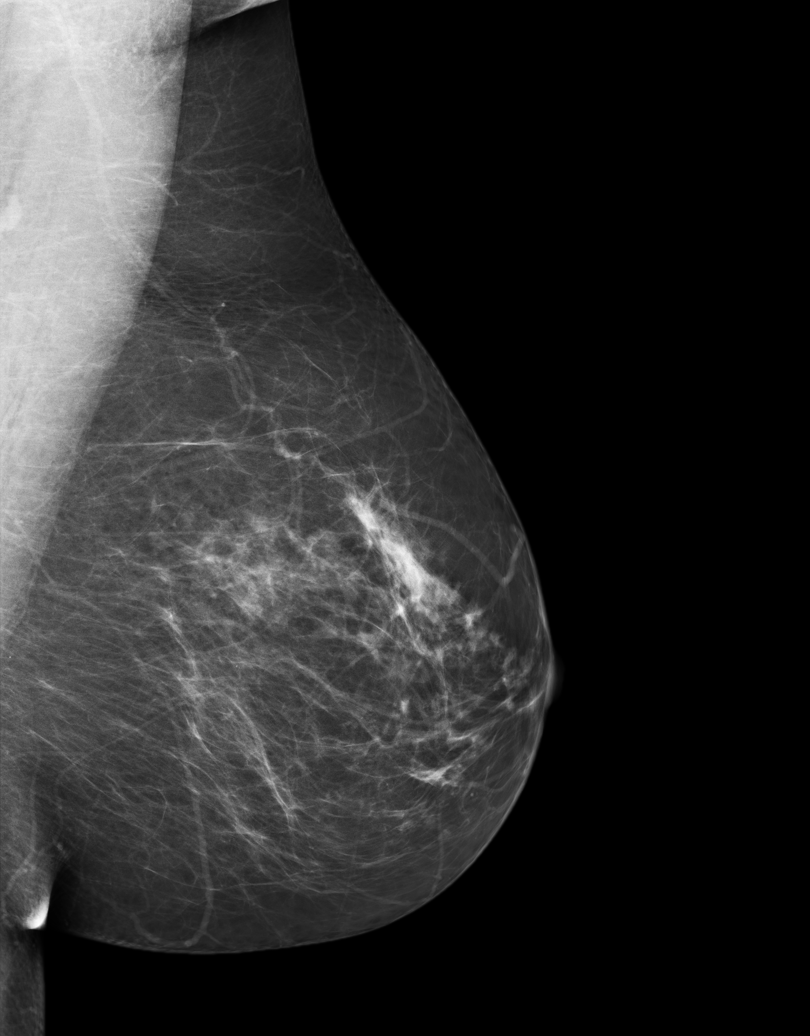

[5 of 5 positions shown; findings below may reference images not displayed]

ACR Breast Density Category c: The breast tissue is heterogeneously
dense, which may obscure small masses.
FINDINGS: There are no findings suspicious for malignancy. Images were
processed with CAD.
IMPRESSION: No mammographic evidence of malignancy. A result letter of this
screening mammogram will be mailed directly to the patient.

RECOMMENDATION:
Screening mammogram in one year. (Code:YJ-2-FEZ)

BI-RADS CATEGORY  1: Negative.

## 2016-01-08 ENCOUNTER — Encounter: Payer: Self-pay | Admitting: *Deleted

## 2016-01-09 ENCOUNTER — Ambulatory Visit: Payer: BLUE CROSS/BLUE SHIELD | Admitting: Anesthesiology

## 2016-01-09 ENCOUNTER — Encounter
Admission: RE | Disposition: A | Discharge status: Home / Self Care | Payer: Self-pay | Source: Ambulatory Visit | Attending: Gastroenterology

## 2016-01-09 ENCOUNTER — Encounter: Payer: Self-pay | Admitting: *Deleted

## 2016-01-09 ENCOUNTER — Ambulatory Visit
Admission: RE | Admit: 2016-01-09 | Discharge: 2016-01-09 | Disposition: A | Discharge status: Home / Self Care | Payer: BLUE CROSS/BLUE SHIELD | Source: Ambulatory Visit | Attending: Gastroenterology | Admitting: Gastroenterology

## 2016-01-09 DIAGNOSIS — Z794 Long term (current) use of insulin: Secondary | ICD-10-CM | POA: Diagnosis not present

## 2016-01-09 DIAGNOSIS — E785 Hyperlipidemia, unspecified: Secondary | ICD-10-CM | POA: Insufficient documentation

## 2016-01-09 DIAGNOSIS — E669 Obesity, unspecified: Secondary | ICD-10-CM | POA: Insufficient documentation

## 2016-01-09 DIAGNOSIS — F329 Major depressive disorder, single episode, unspecified: Secondary | ICD-10-CM | POA: Diagnosis not present

## 2016-01-09 DIAGNOSIS — Z79899 Other long term (current) drug therapy: Secondary | ICD-10-CM | POA: Diagnosis not present

## 2016-01-09 DIAGNOSIS — Z7951 Long term (current) use of inhaled steroids: Secondary | ICD-10-CM | POA: Insufficient documentation

## 2016-01-09 DIAGNOSIS — K5730 Diverticulosis of large intestine without perforation or abscess without bleeding: Secondary | ICD-10-CM | POA: Insufficient documentation

## 2016-01-09 DIAGNOSIS — Z1211 Encounter for screening for malignant neoplasm of colon: Secondary | ICD-10-CM | POA: Insufficient documentation

## 2016-01-09 DIAGNOSIS — D122 Benign neoplasm of ascending colon: Secondary | ICD-10-CM | POA: Insufficient documentation

## 2016-01-09 DIAGNOSIS — I10 Essential (primary) hypertension: Secondary | ICD-10-CM | POA: Diagnosis not present

## 2016-01-09 DIAGNOSIS — Z800 Family history of malignant neoplasm of digestive organs: Secondary | ICD-10-CM | POA: Insufficient documentation

## 2016-01-09 DIAGNOSIS — Z6838 Body mass index (BMI) 38.0-38.9, adult: Secondary | ICD-10-CM | POA: Diagnosis not present

## 2016-01-09 HISTORY — DX: Major depressive disorder, single episode, unspecified: F32.9

## 2016-01-09 HISTORY — DX: Depression, unspecified: F32.A

## 2016-01-09 HISTORY — PX: COLONOSCOPY WITH PROPOFOL: SHX5780

## 2016-01-09 SURGERY — COLONOSCOPY WITH PROPOFOL
Anesthesia: General

## 2016-01-09 MED ORDER — PROPOFOL 10 MG/ML IV BOLUS
INTRAVENOUS | Status: DC | PRN
  Administered 2016-01-09: 90 mg via INTRAVENOUS

## 2016-01-09 MED ORDER — PROPOFOL 500 MG/50ML IV EMUL
INTRAVENOUS | Status: DC | PRN
  Administered 2016-01-09: 150 ug/kg/min via INTRAVENOUS

## 2016-01-09 MED ORDER — SODIUM CHLORIDE 0.9 % IV SOLN
INTRAVENOUS | Status: DC
  Administered 2016-01-09: 09:00:00 via INTRAVENOUS

## 2016-01-09 MED ORDER — PHENYLEPHRINE HCL 10 MG/ML IJ SOLN
INTRAMUSCULAR | Status: DC | PRN
  Administered 2016-01-09: 100 ug via INTRAVENOUS

## 2016-01-09 MED ORDER — SODIUM CHLORIDE 0.9 % IV SOLN: INTRAVENOUS | Status: DC

## 2016-01-09 NOTE — Anesthesia Preprocedure Evaluation (Signed)
Anesthesia Evaluation  Patient identified by MRN, date of birth, ID band Patient awake    Reviewed: Allergy & Precautions, H&P , NPO status , Patient's Chart, lab work & pertinent test results, reviewed documented beta blocker date and time   Airway Mallampati: II   Neck ROM: full    Dental  (+) Poor Dentition   Pulmonary neg pulmonary ROS,    Pulmonary exam normal        Cardiovascular hypertension, negative cardio ROS Normal cardiovascular exam     Neuro/Psych negative neurological ROS  negative psych ROS   GI/Hepatic negative GI ROS, Neg liver ROS,   Endo/Other  negative endocrine ROS  Renal/GU negative Renal ROS  negative genitourinary   Musculoskeletal   Abdominal   Peds  Hematology negative hematology ROS (+)   Anesthesia Other Findings Past Medical History:   Other specified menopausal and postmenopausal *              Hypertension                                                 Obesity, unspecified                                         Hyperlipidemia                                               Depression                                                 Past Surgical History:   CESAREAN SECTION                                 02-22-80       CHOLECYSTECTOMY                                             BMI    Body Mass Index   38.43 kg/m 2     Reproductive/Obstetrics                             Anesthesia Physical Anesthesia Plan  ASA: III  Anesthesia Plan: General   Post-op Pain Management:    Induction:   Airway Management Planned:   Additional Equipment:   Intra-op Plan:   Post-operative Plan:   Informed Consent: I have reviewed the patients History and Physical, chart, labs and discussed the procedure including the risks, benefits and alternatives for the proposed anesthesia with the patient or authorized representative who has indicated his/her understanding  and acceptance.   Dental Advisory Given  Plan Discussed with: CRNA  Anesthesia Plan Comments:         Anesthesia Quick Evaluation

## 2016-01-09 NOTE — Op Note (Signed)
[facility] Gastroenterology Patient Name: Kathleen Olsen Procedure Date: 01/09/2016 9:20 AM MRN: [id number] Account #: [id number] Date of Birth: 06-10-1961 Admit Type: Outpatient Age: 55 Room: [facility] ENDO ROOM 3 Gender: Female Note Status: Finalized Procedure:            Colonoscopy Indications:          Family history of colon cancer in multiple                        second-degree relatives Providers:            Lollie Sails, MD Referring MD:         Hewitt Blade. Sarina Ser, MD (Referring MD) Medicines:            Monitored Anesthesia Care Complications:        No immediate complications. Procedure:            Pre-Anesthesia Assessment:                       - ASA Grade Assessment: III - A patient with severe                        systemic disease.                       After obtaining informed consent, the colonoscope was                        passed under direct vision. Throughout the procedure,                        the patient's blood pressure, pulse, and oxygen                        saturations were monitored continuously. The                        Colonoscope was introduced through the anus and                        advanced to the the cecum, identified by appendiceal                        orifice and ileocecal valve. The colonoscopy was                        unusually difficult due to significant looping and a                        tortuous colon. Successful completion of the procedure                        was aided by changing the patient to a supine position,                        changing the patient to a prone position, using manual                        pressure and withdrawing and reinserting the scope. The  quality of the bowel preparation was good. Findings:      A 4 mm polyp was found in the proximal ascending colon. The polyp was       sessile. The polyp was removed with a cold biopsy forceps. Resection and     retrieval were complete.      Multiple small-mouthed diverticula were found in the sigmoid colon and       descending colon.      The retroflexed view of the distal rectum and anal verge was normal and       showed no anal or rectal abnormalities, note multiple internal anal       pillars.      The digital rectal exam was normal.      The exam was otherwise normal throughout the examined colon. Impression:           - One 4 mm polyp in the proximal ascending colon,                        removed with a cold biopsy forceps. Resected and                        retrieved.                       - Diverticulosis in the sigmoid colon and in the                        descending colon.                       - The distal rectum and anal verge are normal on                        retroflexion view. Recommendation:       - Await pathology results. Procedure Code(s):    --- Professional ---                       [id number], Colonoscopy, flexible; with biopsy, single or                        multiple Diagnosis Code(s):    --- Professional ---                       D12.2, Benign neoplasm of ascending colon                       Z80.0, Family history of malignant neoplasm of                        digestive organs                       K57.30, Diverticulosis of large intestine without                        perforation or abscess without bleeding CPT copyright 2016 American Medical Association. All rights reserved. The codes documented in this report are preliminary and upon coder review may  be revised to meet current compliance requirements. Lollie Sails, MD 01/09/2016 10:23:12 AM This report has been signed electronically. Number of Addenda: 0 Note Initiated On: 01/09/2016 9:20  AM Scope Withdrawal Time: 0 hours 11 minutes 18 seconds  Total Procedure Duration: 0 hours 50 minutes 55 seconds       [facility]

## 2016-01-09 NOTE — Anesthesia Postprocedure Evaluation (Signed)
Anesthesia Post Note  Patient: Kathleen Olsen  Procedure(s) Performed: Procedure(s) (LRB): COLONOSCOPY WITH PROPOFOL (N/A)  Patient location during evaluation: PACU Anesthesia Type: General Level of consciousness: awake and alert Pain management: pain level controlled Vital Signs Assessment: post-procedure vital signs reviewed and stable Respiratory status: spontaneous breathing, nonlabored ventilation, respiratory function stable and patient connected to nasal cannula oxygen Cardiovascular status: blood pressure returned to baseline and stable Postop Assessment: no signs of nausea or vomiting Anesthetic complications: no    Last Vitals:  Filed Vitals:   01/09/16 1034 01/09/16 1044  BP: 100/58 130/63  Pulse: 82 73  Temp:    Resp: 20 21    Last Pain: There were no vitals filed for this visit.               Molli Barrows

## 2016-01-09 NOTE — Transfer of Care (Signed)
Immediate Anesthesia Transfer of Care Note  Patient: Kathleen Olsen  Procedure(s) Performed: Procedure(s): COLONOSCOPY WITH PROPOFOL (N/A)  Patient Location: PACU, SICU and Endoscopy Unit  Anesthesia Type:General  Level of Consciousness: awake, alert  and oriented  Airway & Oxygen Therapy: Patient Spontanous Breathing and Patient connected to nasal cannula oxygen  Post-op Assessment: Report given to RN and Post -op Vital signs reviewed and stable  Post vital signs: Reviewed and stable  Last Vitals:  Filed Vitals:   01/09/16 0857 01/09/16 1023  BP: 135/76 105/86  Pulse: 93 90  Temp: 35.7 C 35.6 C  Resp: 18     Complications: No apparent anesthesia complications

## 2016-01-09 NOTE — H&P (Addendum)
Outpatient short stay form Pre-procedure 01/09/2016 9:10 AM Lollie Sails MD  Primary Physician: Dr. Ronette Deter  Reason for visit:  Colonoscopy  History of present illness:  patient is a 55 year old female presenting today for colonoscopy. She has a strong family history of colon cancers in secondary relatives. There is also a strong family history of multiple other cancers including cervical, breast, ovarian, uterine. She has not had genetic testing and currently declines. She did tolerate her prep very well. He has held any aspirin products for over week. Takes no blood thinning agents.    Current facility-administered medications:  .  0.9 %  sodium chloride infusion, , Intravenous, Continuous, Lollie Sails, MD, Last Rate: 20 mL/hr at 01/09/16 0910 .  0.9 %  sodium chloride infusion, , Intravenous, Continuous, Lollie Sails, MD  Prescriptions prior to admission  Medication Sig Dispense Refill Last Dose  . Cholecalciferol (VITAMIN D PO) Take by mouth as needed.   Past Week at Unknown time  . CINNAMON PO Take by mouth as needed.   Past Week at Unknown time  . Insulin Pen Needle 33G X 5 MM MISC 1 application by Does not apply route daily. 100 each 2 Past Week at Unknown time  . Liraglutide -Weight Management (SAXENDA) 18 MG/3ML SOPN Inject 0.6 mg into the skin daily. 3 mL 3 Past Week at Unknown time  . sertraline (ZOLOFT) 50 MG tablet Take 1 tablet (50 mg total) by mouth daily. 30 tablet 11 01/08/2016 at Unknown time  . valsartan-hydrochlorothiazide (DIOVAN-HCT) 80-12.5 MG tablet Take 1 tablet by mouth daily. 30 tablet 11 01/08/2016 at Unknown time  . cetirizine (ZYRTEC) 10 MG tablet Take 10 mg by mouth at bedtime.  4 Taking  . fluticasone (FLONASE) 50 MCG/ACT nasal spray Place 2 sprays into both nostrils daily.  4 Taking     No Known Allergies   Past Medical History  Diagnosis Date  . Other specified menopausal and postmenopausal disorder   . Hypertension   .  Obesity, unspecified   . Hyperlipidemia   . Depression     Review of systems:      Physical Exam    Heart and lungs: Regular rate and rhythm without rub or gallop, lungs are bilaterally clear.    HEENT: Normocephalic atraumatic eyes are anicteric    Other:     Pertinant exam for procedure: Soft nontender nondistended bowel sounds positive normoactive.    Planned proceedures: Colonoscopy and indicated procedures. I have discussed the risks benefits and complications of procedures to include not limited to bleeding, infection, perforation and the risk of sedation and the patient wishes to proceed.    Lollie Sails, MD Gastroenterology 01/09/2016  9:10 AM

## 2016-01-10 ENCOUNTER — Encounter: Payer: Self-pay | Admitting: Gastroenterology

## 2016-01-12 LAB — SURGICAL PATHOLOGY

## 2016-01-21 ENCOUNTER — Ambulatory Visit
Admission: RE | Admit: 2016-01-21 | Discharge: 2016-01-21 | Disposition: A | Discharge status: Home / Self Care | Payer: BLUE CROSS/BLUE SHIELD | Source: Ambulatory Visit | Attending: Orthopedic Surgery | Admitting: Orthopedic Surgery

## 2016-01-21 DIAGNOSIS — M25561 Pain in right knee: Secondary | ICD-10-CM | POA: Diagnosis not present

## 2016-01-21 DIAGNOSIS — M7121 Synovial cyst of popliteal space [Baker], right knee: Secondary | ICD-10-CM | POA: Insufficient documentation

## 2016-01-21 DIAGNOSIS — M1711 Unilateral primary osteoarthritis, right knee: Secondary | ICD-10-CM | POA: Insufficient documentation

## 2016-02-17 DIAGNOSIS — Z1231 Encounter for screening mammogram for malignant neoplasm of breast: Secondary | ICD-10-CM | POA: Diagnosis not present

## 2016-02-26 DIAGNOSIS — M1711 Unilateral primary osteoarthritis, right knee: Secondary | ICD-10-CM | POA: Diagnosis not present

## 2016-04-28 DIAGNOSIS — M1711 Unilateral primary osteoarthritis, right knee: Secondary | ICD-10-CM | POA: Diagnosis not present

## 2016-04-29 ENCOUNTER — Other Ambulatory Visit: Payer: Self-pay | Admitting: Orthopedic Surgery

## 2016-04-29 DIAGNOSIS — M1711 Unilateral primary osteoarthritis, right knee: Secondary | ICD-10-CM

## 2016-04-30 ENCOUNTER — Ambulatory Visit
Admission: RE | Admit: 2016-04-30 | Discharge: 2016-04-30 | Disposition: A | Discharge status: Home / Self Care | Payer: BLUE CROSS/BLUE SHIELD | Source: Ambulatory Visit | Attending: Orthopedic Surgery | Admitting: Orthopedic Surgery

## 2016-04-30 DIAGNOSIS — M1711 Unilateral primary osteoarthritis, right knee: Secondary | ICD-10-CM | POA: Diagnosis not present

## 2016-04-30 DIAGNOSIS — M179 Osteoarthritis of knee, unspecified: Secondary | ICD-10-CM | POA: Diagnosis not present

## 2016-05-19 ENCOUNTER — Encounter
Admission: RE | Admit: 2016-05-19 | Discharge: 2016-05-19 | Disposition: A | Discharge status: Home / Self Care | Payer: BLUE CROSS/BLUE SHIELD | Source: Ambulatory Visit | Attending: Orthopedic Surgery | Admitting: Orthopedic Surgery

## 2016-05-19 DIAGNOSIS — Z029 Encounter for administrative examinations, unspecified: Secondary | ICD-10-CM | POA: Diagnosis not present

## 2016-05-19 DIAGNOSIS — Z01810 Encounter for preprocedural cardiovascular examination: Secondary | ICD-10-CM | POA: Diagnosis not present

## 2016-05-19 DIAGNOSIS — Z01812 Encounter for preprocedural laboratory examination: Secondary | ICD-10-CM | POA: Diagnosis not present

## 2016-05-19 DIAGNOSIS — M1711 Unilateral primary osteoarthritis, right knee: Secondary | ICD-10-CM | POA: Diagnosis not present

## 2016-05-19 DIAGNOSIS — I10 Essential (primary) hypertension: Secondary | ICD-10-CM | POA: Diagnosis not present

## 2016-05-19 LAB — CBC
HCT: 38.2 % (ref 35.0–47.0)
HEMOGLOBIN: 13.3 g/dL (ref 12.0–16.0)
MCH: 30.8 pg (ref 26.0–34.0)
MCHC: 34.8 g/dL (ref 32.0–36.0)
MCV: 88.5 fL (ref 80.0–100.0)
Platelets: 304 10*3/uL (ref 150–440)
RBC: 4.32 MIL/uL (ref 3.80–5.20)
RDW: 13.1 % (ref 11.5–14.5)
WBC: 7 10*3/uL (ref 3.6–11.0)

## 2016-05-19 LAB — URINALYSIS COMPLETE WITH MICROSCOPIC (ARMC ONLY)
BACTERIA UA: NONE SEEN
Bilirubin Urine: NEGATIVE
Glucose, UA: NEGATIVE mg/dL
Hgb urine dipstick: NEGATIVE
Ketones, ur: NEGATIVE mg/dL
Nitrite: NEGATIVE
PROTEIN: NEGATIVE mg/dL
Specific Gravity, Urine: 1.009 (ref 1.005–1.030)
pH: 7 (ref 5.0–8.0)

## 2016-05-19 LAB — BASIC METABOLIC PANEL
Anion gap: 12 (ref 5–15)
BUN: 14 mg/dL (ref 6–20)
CO2: 23 mmol/L (ref 22–32)
CREATININE: 1.06 mg/dL — AB (ref 0.44–1.00)
Calcium: 9.3 mg/dL (ref 8.9–10.3)
Chloride: 105 mmol/L (ref 101–111)
GFR calc Af Amer: >60 mL/min (ref >60)
GFR, EST NON AFRICAN AMERICAN: 58 mL/min — AB (ref >60)
Glucose, Bld: 103 mg/dL — ABNORMAL HIGH (ref 65–99)
POTASSIUM: 3.6 mmol/L (ref 3.5–5.1)
SODIUM: 140 mmol/L (ref 135–145)

## 2016-05-19 LAB — SEDIMENTATION RATE: SED RATE: 14 mm/h (ref 0–30)

## 2016-05-19 LAB — PROTIME-INR
INR: 1
PROTHROMBIN TIME: 13.2 s (ref 11.4–15.2)

## 2016-05-19 LAB — SURGICAL PCR SCREEN
MRSA, PCR: NEGATIVE
STAPHYLOCOCCUS AUREUS: NEGATIVE

## 2016-05-19 LAB — TYPE AND SCREEN
ABO/RH(D): B POS
ANTIBODY SCREEN: NEGATIVE

## 2016-05-19 LAB — APTT: aPTT: 31 seconds (ref 24–36)

## 2016-05-19 NOTE — Patient Instructions (Signed)
Your procedure is scheduled on: Tuesday 06/01/16 Report to Day Surgery. 2ND FLOOR MEDICAL MALL ENTRANCE To find out your arrival time please call [phone] between 1PM - 3PM on Monday 05/31/16.  Remember: Instructions that are not followed completely may result in serious medical risk, up to and including death, or upon the discretion of your surgeon and anesthesiologist your surgery may need to be rescheduled.    __X__ 1. Do not eat food or drink liquids after midnight. No gum chewing or hard candies.     __X__ 2. No Alcohol for 24 hours before or after surgery.   ____ 3. Bring all medications with you on the day of surgery if instructed.    __X__ 4. Notify your doctor if there is any change in your medical condition     (cold, fever, infections).     Do not wear jewelry, make-up, hairpins, clips or nail polish.  Do not wear lotions, powders, or perfumes.   Do not shave 48 hours prior to surgery. Men may shave face and neck.  Do not bring valuables to the hospital.    [facility] is not responsible for any belongings or valuables.               Contacts, dentures or bridgework may not be worn into surgery.  Leave your suitcase in the car. After surgery it may be brought to your room.  For patients admitted to the hospital, discharge time is determined by your                treatment team.   Patients discharged the day of surgery will not be allowed to drive home.   Please read over the following fact sheets that you were given:   MRSA Information and Surgical Site Infection Prevention   ____ Take these medicines the morning of surgery with A SIP OF WATER:    1. NONE  2.   3.   4.  5.  6.  ____ Fleet Enema (as directed)   __X__ Use CHG Soap as directed  __X__ Use inhalers on the day of surgery  ____ Stop metformin 2 days prior to surgery    ____ Take 1/2 of usual insulin dose the night before surgery and none on the morning of surgery.   ____ Stop  Coumadin/Plavix/aspirin on   __X__ Stop Anti-inflammatories on STOP IBUPROFEN 7 DAYS BEFORE SURGERY LAST DOSE 05/24/16   ____ Stop supplements until after surgery.    ____ Bring C-Pap to the hospital.

## 2016-05-20 LAB — URINE CULTURE

## 2016-05-25 NOTE — Pre-Procedure Instructions (Signed)
Urine culture results faxed to Dr. Rudene Christians asking if test needs to be repeated?

## 2016-06-01 ENCOUNTER — Encounter: Payer: Self-pay | Admitting: *Deleted

## 2016-06-01 ENCOUNTER — Inpatient Hospital Stay: Payer: BLUE CROSS/BLUE SHIELD

## 2016-06-01 ENCOUNTER — Inpatient Hospital Stay: Payer: BLUE CROSS/BLUE SHIELD | Admitting: Certified Registered Nurse Anesthetist

## 2016-06-01 ENCOUNTER — Inpatient Hospital Stay
Admission: RE | Admit: 2016-06-01 | Discharge: 2016-06-04 | DRG: 470 | Disposition: A | Discharge status: Home Health | Payer: BLUE CROSS/BLUE SHIELD | Source: Ambulatory Visit | Attending: Orthopedic Surgery | Admitting: Orthopedic Surgery

## 2016-06-01 ENCOUNTER — Encounter
Admission: RE | Disposition: A | Discharge status: Home Health | Payer: Self-pay | Source: Ambulatory Visit | Attending: Orthopedic Surgery

## 2016-06-01 DIAGNOSIS — E871 Hypo-osmolality and hyponatremia: Secondary | ICD-10-CM | POA: Diagnosis not present

## 2016-06-01 DIAGNOSIS — R2681 Unsteadiness on feet: Secondary | ICD-10-CM | POA: Diagnosis not present

## 2016-06-01 DIAGNOSIS — E669 Obesity, unspecified: Secondary | ICD-10-CM | POA: Diagnosis not present

## 2016-06-01 DIAGNOSIS — E785 Hyperlipidemia, unspecified: Secondary | ICD-10-CM | POA: Diagnosis present

## 2016-06-01 DIAGNOSIS — Z6837 Body mass index (BMI) 37.0-37.9, adult: Secondary | ICD-10-CM | POA: Diagnosis not present

## 2016-06-01 DIAGNOSIS — J45909 Unspecified asthma, uncomplicated: Secondary | ICD-10-CM | POA: Diagnosis present

## 2016-06-01 DIAGNOSIS — M1711 Unilateral primary osteoarthritis, right knee: Secondary | ICD-10-CM | POA: Diagnosis not present

## 2016-06-01 DIAGNOSIS — Z79899 Other long term (current) drug therapy: Secondary | ICD-10-CM | POA: Diagnosis not present

## 2016-06-01 DIAGNOSIS — E876 Hypokalemia: Secondary | ICD-10-CM | POA: Diagnosis not present

## 2016-06-01 DIAGNOSIS — G8918 Other acute postprocedural pain: Secondary | ICD-10-CM

## 2016-06-01 DIAGNOSIS — Z471 Aftercare following joint replacement surgery: Secondary | ICD-10-CM | POA: Diagnosis not present

## 2016-06-01 DIAGNOSIS — D62 Acute posthemorrhagic anemia: Secondary | ICD-10-CM | POA: Diagnosis not present

## 2016-06-01 DIAGNOSIS — Z96651 Presence of right artificial knee joint: Secondary | ICD-10-CM | POA: Diagnosis not present

## 2016-06-01 DIAGNOSIS — I10 Essential (primary) hypertension: Secondary | ICD-10-CM | POA: Diagnosis not present

## 2016-06-01 DIAGNOSIS — F329 Major depressive disorder, single episode, unspecified: Secondary | ICD-10-CM | POA: Diagnosis not present

## 2016-06-01 DIAGNOSIS — M25561 Pain in right knee: Secondary | ICD-10-CM | POA: Diagnosis not present

## 2016-06-01 HISTORY — PX: TOTAL KNEE ARTHROPLASTY: SHX125

## 2016-06-01 LAB — CREATININE, SERUM
CREATININE: 0.4 mg/dL — AB (ref 0.44–1.00)
GFR calc non Af Amer: >60 mL/min (ref >60)

## 2016-06-01 LAB — CBC
HCT: 25.7 % — ABNORMAL LOW (ref 35.0–47.0)
HEMOGLOBIN: 8.7 g/dL — AB (ref 12.0–16.0)
MCH: 30.3 pg (ref 26.0–34.0)
MCHC: 33.8 g/dL (ref 32.0–36.0)
MCV: 89.6 fL (ref 80.0–100.0)
PLATELETS: 198 10*3/uL (ref 150–440)
RBC: 2.87 MIL/uL — AB (ref 3.80–5.20)
RDW: 13 % (ref 11.5–14.5)
WBC: 8.9 10*3/uL (ref 3.6–11.0)

## 2016-06-01 SURGERY — ARTHROPLASTY, KNEE, TOTAL
Anesthesia: Spinal | Site: Knee | Laterality: Right | Wound class: Clean

## 2016-06-01 MED ORDER — BUPIVACAINE-EPINEPHRINE (PF) 0.25% -1:200000 IJ SOLN
INTRAMUSCULAR | Status: AC
  Filled 2016-06-01: qty 30

## 2016-06-01 MED ORDER — NEOMYCIN-POLYMYXIN B GU 40-200000 IR SOLN
Status: AC
  Filled 2016-06-01: qty 20

## 2016-06-01 MED ORDER — ENOXAPARIN SODIUM 30 MG/0.3ML ~~LOC~~ SOLN
30.0000 mg | Freq: Two times a day (BID) | SUBCUTANEOUS | Status: DC
  Administered 2016-06-02 – 2016-06-04 (×5): 30 mg via SUBCUTANEOUS
  Filled 2016-06-01 (×5): qty 0.3

## 2016-06-01 MED ORDER — BISACODYL 5 MG PO TBEC
5.0000 mg | DELAYED_RELEASE_TABLET | Freq: Every day | ORAL | Status: DC | PRN
  Administered 2016-06-04: 5 mg via ORAL
  Filled 2016-06-01: qty 1

## 2016-06-01 MED ORDER — BUPIVACAINE HCL (PF) 0.5 % IJ SOLN
INTRAMUSCULAR | Status: DC | PRN
  Administered 2016-06-01: 3 mL

## 2016-06-01 MED ORDER — FENTANYL CITRATE (PF) 100 MCG/2ML IJ SOLN
INTRAMUSCULAR | Status: DC | PRN
  Administered 2016-06-01 (×2): 50 ug via INTRAVENOUS

## 2016-06-01 MED ORDER — ONDANSETRON HCL 4 MG/2ML IJ SOLN: 4.0000 mg | Freq: Four times a day (QID) | INTRAMUSCULAR | Status: DC | PRN

## 2016-06-01 MED ORDER — PHENOL 1.4 % MT LIQD
1.0000 | OROMUCOSAL | Status: DC | PRN
  Filled 2016-06-01: qty 177

## 2016-06-01 MED ORDER — MEPERIDINE HCL 25 MG/ML IJ SOLN: 6.2500 mg | INTRAMUSCULAR | Status: DC | PRN

## 2016-06-01 MED ORDER — METOCLOPRAMIDE HCL 10 MG PO TABS: 5.0000 mg | ORAL_TABLET | Freq: Three times a day (TID) | ORAL | Status: DC | PRN

## 2016-06-01 MED ORDER — IRBESARTAN 150 MG PO TABS
75.0000 mg | ORAL_TABLET | Freq: Every day | ORAL | Status: DC
  Administered 2016-06-01 – 2016-06-04 (×4): 75 mg via ORAL
  Filled 2016-06-01 (×4): qty 1

## 2016-06-01 MED ORDER — NEOMYCIN-POLYMYXIN B GU 40-200000 IR SOLN
Status: DC | PRN
  Administered 2016-06-01: 14 mL

## 2016-06-01 MED ORDER — KETOROLAC TROMETHAMINE 30 MG/ML IJ SOLN
INTRAMUSCULAR | Status: DC | PRN
  Administered 2016-06-01: 30 mg

## 2016-06-01 MED ORDER — MENTHOL 3 MG MT LOZG
1.0000 | LOZENGE | OROMUCOSAL | Status: DC | PRN
  Filled 2016-06-01: qty 9

## 2016-06-01 MED ORDER — SODIUM CHLORIDE 0.9 % IJ SOLN
INTRAMUSCULAR | Status: AC
  Filled 2016-06-01: qty 100

## 2016-06-01 MED ORDER — PROMETHAZINE HCL 25 MG/ML IJ SOLN: 6.2500 mg | INTRAMUSCULAR | Status: DC | PRN

## 2016-06-01 MED ORDER — FLUTICASONE PROPIONATE 50 MCG/ACT NA SUSP
1.0000 | Freq: Every day | NASAL | Status: DC | PRN
  Filled 2016-06-01: qty 16

## 2016-06-01 MED ORDER — MIDAZOLAM HCL 5 MG/5ML IJ SOLN
INTRAMUSCULAR | Status: DC | PRN
  Administered 2016-06-01 (×2): 1 mg via INTRAVENOUS

## 2016-06-01 MED ORDER — FAMOTIDINE 20 MG PO TABS
ORAL_TABLET | ORAL | Status: AC
  Administered 2016-06-01: 20 mg via ORAL
  Filled 2016-06-01: qty 1

## 2016-06-01 MED ORDER — MORPHINE SULFATE 10 MG/ML IJ SOLN
INTRAMUSCULAR | Status: DC | PRN
  Administered 2016-06-01: 10 mg

## 2016-06-01 MED ORDER — ACETAMINOPHEN 10 MG/ML IV SOLN
INTRAVENOUS | Status: DC | PRN
  Administered 2016-06-01: 1000 mg via INTRAVENOUS

## 2016-06-01 MED ORDER — FENTANYL CITRATE (PF) 100 MCG/2ML IJ SOLN: 25.0000 ug | INTRAMUSCULAR | Status: DC | PRN

## 2016-06-01 MED ORDER — ONDANSETRON HCL 4 MG PO TABS: 4.0000 mg | ORAL_TABLET | Freq: Four times a day (QID) | ORAL | Status: DC | PRN

## 2016-06-01 MED ORDER — BUPIVACAINE-EPINEPHRINE (PF) 0.25% -1:200000 IJ SOLN
INTRAMUSCULAR | Status: DC | PRN
  Administered 2016-06-01: 30 mL

## 2016-06-01 MED ORDER — METHOCARBAMOL 1000 MG/10ML IJ SOLN
500.0000 mg | Freq: Four times a day (QID) | INTRAMUSCULAR | Status: DC | PRN
  Filled 2016-06-01: qty 5

## 2016-06-01 MED ORDER — PROPOFOL 10 MG/ML IV BOLUS
INTRAVENOUS | Status: DC | PRN
  Administered 2016-06-01: 20 mg via INTRAVENOUS

## 2016-06-01 MED ORDER — LACTATED RINGERS IV SOLN
INTRAVENOUS | Status: DC
  Administered 2016-06-01: 07:00:00 via INTRAVENOUS

## 2016-06-01 MED ORDER — VALSARTAN-HYDROCHLOROTHIAZIDE 80-12.5 MG PO TABS: 1.0000 | ORAL_TABLET | Freq: Every day | ORAL | Status: DC

## 2016-06-01 MED ORDER — METHOCARBAMOL 500 MG PO TABS: 500.0000 mg | ORAL_TABLET | Freq: Four times a day (QID) | ORAL | Status: DC | PRN

## 2016-06-01 MED ORDER — DIPHENHYDRAMINE HCL 12.5 MG/5ML PO ELIX: 12.5000 mg | ORAL_SOLUTION | ORAL | Status: DC | PRN

## 2016-06-01 MED ORDER — PROPOFOL 500 MG/50ML IV EMUL
INTRAVENOUS | Status: DC | PRN
  Administered 2016-06-01: 75 ug/kg/min via INTRAVENOUS

## 2016-06-01 MED ORDER — CEFAZOLIN SODIUM-DEXTROSE 2-4 GM/100ML-% IV SOLN
2.0000 g | Freq: Four times a day (QID) | INTRAVENOUS | Status: AC
  Administered 2016-06-01 – 2016-06-02 (×3): 2 g via INTRAVENOUS
  Filled 2016-06-01 (×3): qty 100

## 2016-06-01 MED ORDER — DEXAMETHASONE SODIUM PHOSPHATE 10 MG/ML IJ SOLN
INTRAMUSCULAR | Status: DC | PRN
  Administered 2016-06-01: 5 mg via INTRAVENOUS

## 2016-06-01 MED ORDER — DOCUSATE SODIUM 100 MG PO CAPS
100.0000 mg | ORAL_CAPSULE | Freq: Two times a day (BID) | ORAL | Status: DC
  Administered 2016-06-01 – 2016-06-04 (×7): 100 mg via ORAL
  Filled 2016-06-01 (×7): qty 1

## 2016-06-01 MED ORDER — MAGNESIUM HYDROXIDE 400 MG/5ML PO SUSP
30.0000 mL | Freq: Every day | ORAL | Status: DC | PRN
  Filled 2016-06-01 (×2): qty 30

## 2016-06-01 MED ORDER — CEFAZOLIN SODIUM-DEXTROSE 2-4 GM/100ML-% IV SOLN
INTRAVENOUS | Status: AC
  Filled 2016-06-01: qty 100

## 2016-06-01 MED ORDER — ALBUTEROL SULFATE (2.5 MG/3ML) 0.083% IN NEBU: 3.0000 mL | INHALATION_SOLUTION | Freq: Four times a day (QID) | RESPIRATORY_TRACT | Status: DC | PRN

## 2016-06-01 MED ORDER — OXYCODONE HCL 5 MG PO TABS
5.0000 mg | ORAL_TABLET | ORAL | Status: DC | PRN
  Administered 2016-06-01: 5 mg via ORAL
  Administered 2016-06-01 (×2): 10 mg via ORAL
  Administered 2016-06-01: 5 mg via ORAL
  Administered 2016-06-02 – 2016-06-04 (×14): 10 mg via ORAL
  Filled 2016-06-01 (×9): qty 2
  Filled 2016-06-01: qty 1
  Filled 2016-06-01 (×8): qty 2
  Filled 2016-06-01: qty 1

## 2016-06-01 MED ORDER — MORPHINE SULFATE (PF) 10 MG/ML IV SOLN
INTRAVENOUS | Status: AC
Start: 2016-06-01 — End: 2016-06-01
  Filled 2016-06-01: qty 1

## 2016-06-01 MED ORDER — MAGNESIUM CITRATE PO SOLN
1.0000 | Freq: Once | ORAL | Status: AC | PRN
  Administered 2016-06-04: 1 via ORAL
  Filled 2016-06-01 (×2): qty 296

## 2016-06-01 MED ORDER — SODIUM CHLORIDE 0.9 % IV SOLN
INTRAVENOUS | Status: DC | PRN
  Administered 2016-06-01: 60 mL

## 2016-06-01 MED ORDER — ACETAMINOPHEN 325 MG PO TABS: 650.0000 mg | ORAL_TABLET | Freq: Four times a day (QID) | ORAL | Status: DC | PRN

## 2016-06-01 MED ORDER — ONDANSETRON HCL 4 MG/2ML IJ SOLN
INTRAMUSCULAR | Status: DC | PRN
  Administered 2016-06-01: 4 mg via INTRAVENOUS

## 2016-06-01 MED ORDER — HYDROCHLOROTHIAZIDE 12.5 MG PO CAPS
12.5000 mg | ORAL_CAPSULE | Freq: Every day | ORAL | Status: DC
  Administered 2016-06-01 – 2016-06-04 (×4): 12.5 mg via ORAL
  Filled 2016-06-01 (×4): qty 1

## 2016-06-01 MED ORDER — LIDOCAINE HCL (CARDIAC) 20 MG/ML IV SOLN
INTRAVENOUS | Status: DC | PRN
Start: 2016-06-01 — End: 2016-06-01
  Administered 2016-06-01: 50 mg via INTRAVENOUS

## 2016-06-01 MED ORDER — BUPIVACAINE LIPOSOME 1.3 % IJ SUSP
INTRAMUSCULAR | Status: AC
  Filled 2016-06-01: qty 20

## 2016-06-01 MED ORDER — ZOLPIDEM TARTRATE 5 MG PO TABS: 5.0000 mg | ORAL_TABLET | Freq: Every evening | ORAL | Status: DC | PRN

## 2016-06-01 MED ORDER — ACETAMINOPHEN 10 MG/ML IV SOLN
INTRAVENOUS | Status: AC
  Filled 2016-06-01: qty 100

## 2016-06-01 MED ORDER — OXYCODONE HCL 5 MG/5ML PO SOLN: 5.0000 mg | Freq: Once | ORAL | Status: DC | PRN

## 2016-06-01 MED ORDER — SODIUM CHLORIDE 0.9 % IJ SOLN
INTRAMUSCULAR | Status: AC
  Filled 2016-06-01: qty 50

## 2016-06-01 MED ORDER — FAMOTIDINE 20 MG PO TABS
20.0000 mg | ORAL_TABLET | Freq: Once | ORAL | Status: AC
  Administered 2016-06-01: 20 mg via ORAL

## 2016-06-01 MED ORDER — LORATADINE 10 MG PO TABS
10.0000 mg | ORAL_TABLET | Freq: Every day | ORAL | Status: DC
  Administered 2016-06-01 – 2016-06-04 (×4): 10 mg via ORAL
  Filled 2016-06-01 (×4): qty 1

## 2016-06-01 MED ORDER — TRANEXAMIC ACID 1000 MG/10ML IV SOLN
1500.0000 mg | INTRAVENOUS | Status: AC
  Administered 2016-06-01: 1500 mg via INTRAVENOUS
  Filled 2016-06-01: qty 15

## 2016-06-01 MED ORDER — OXYCODONE HCL 5 MG PO TABS: 5.0000 mg | ORAL_TABLET | Freq: Once | ORAL | Status: DC | PRN

## 2016-06-01 MED ORDER — SERTRALINE HCL 50 MG PO TABS
50.0000 mg | ORAL_TABLET | Freq: Every day | ORAL | Status: DC
  Administered 2016-06-01 – 2016-06-03 (×3): 50 mg via ORAL
  Filled 2016-06-01 (×3): qty 1

## 2016-06-01 MED ORDER — SODIUM CHLORIDE 0.9 % IV SOLN
INTRAVENOUS | Status: DC
  Administered 2016-06-01: 14:00:00 via INTRAVENOUS

## 2016-06-01 MED ORDER — ALUM & MAG HYDROXIDE-SIMETH 200-200-20 MG/5ML PO SUSP: 30.0000 mL | ORAL | Status: DC | PRN

## 2016-06-01 MED ORDER — ACETAMINOPHEN 650 MG RE SUPP: 650.0000 mg | Freq: Four times a day (QID) | RECTAL | Status: DC | PRN

## 2016-06-01 MED ORDER — CEFAZOLIN SODIUM-DEXTROSE 2-4 GM/100ML-% IV SOLN
2.0000 g | Freq: Once | INTRAVENOUS | Status: AC
  Administered 2016-06-01: 2 g via INTRAVENOUS

## 2016-06-01 MED ORDER — MORPHINE SULFATE (PF) 2 MG/ML IV SOLN: 2.0000 mg | INTRAVENOUS | Status: DC | PRN

## 2016-06-01 MED ORDER — METOCLOPRAMIDE HCL 5 MG/ML IJ SOLN: 5.0000 mg | Freq: Three times a day (TID) | INTRAMUSCULAR | Status: DC | PRN

## 2016-06-01 SURGICAL SUPPLY — 60 items
BANDAGE ACE 6X5 VEL STRL LF (GAUZE/BANDAGES/DRESSINGS) ×2 IMPLANT
BLADE SAW 1 (BLADE) ×2 IMPLANT
BLOCK CUTTING FEMUR 4 RT (MISCELLANEOUS) IMPLANT
BLOCK CUTTING TIBIAL 3 RT (MISCELLANEOUS) IMPLANT
BLOCK CUTTING TIBIAL 4 RT MIS (MISCELLANEOUS) IMPLANT
CANISTER SUCT 1200ML W/VALVE (MISCELLANEOUS) ×2 IMPLANT
CANISTER SUCT 3000ML (MISCELLANEOUS) ×4 IMPLANT
CAPT KNEE TOTAL 3 ×2 IMPLANT
CATH FOL LEG HOLDER (MISCELLANEOUS) ×2 IMPLANT
CATH TRAY METER 16FR LF (MISCELLANEOUS) ×2 IMPLANT
CEMENT HV SMART SET (Cement) ×4 IMPLANT
CHLORAPREP W/TINT 26ML (MISCELLANEOUS) ×4 IMPLANT
COOLER POLAR GLACIER W/PUMP (MISCELLANEOUS) ×2 IMPLANT
CUFF TOURN 24 STER (MISCELLANEOUS) IMPLANT
CUFF TOURN 30 STER DUAL PORT (MISCELLANEOUS) ×2 IMPLANT
DRAPE INCISE IOBAN 66X45 STRL (DRAPES) ×4 IMPLANT
DRAPE SHEET LG 3/4 BI-LAMINATE (DRAPES) ×4 IMPLANT
ELECT CAUTERY BLADE 6.4 (BLADE) ×2 IMPLANT
ELECT REM PT RETURN 9FT ADLT (ELECTROSURGICAL) ×2
ELECTRODE REM PT RTRN 9FT ADLT (ELECTROSURGICAL) ×1 IMPLANT
FEMUR BONE MODEL (Bone Implant) ×2 IMPLANT
GAUZE PETRO XEROFOAM 1X8 (MISCELLANEOUS) ×2 IMPLANT
GAUZE SPONGE 4X4 12PLY STRL (GAUZE/BANDAGES/DRESSINGS) ×2 IMPLANT
GLOVE BIOGEL PI IND STRL 9 (GLOVE) ×1 IMPLANT
GLOVE BIOGEL PI INDICATOR 9 (GLOVE) ×1
GLOVE INDICATOR 8.0 STRL GRN (GLOVE) ×2 IMPLANT
GLOVE SURG ORTHO 8.0 STRL STRW (GLOVE) ×2 IMPLANT
GLOVE SURG ORTHO 9.0 STRL STRW (GLOVE) ×2 IMPLANT
GOWN SRG 2XL LVL 4 RGLN SLV (GOWNS) ×1 IMPLANT
GOWN STRL NON-REIN 2XL LVL4 (GOWNS) ×1
GOWN STRL REUS W/ TWL LRG LVL3 (GOWN DISPOSABLE) ×1 IMPLANT
GOWN STRL REUS W/ TWL XL LVL3 (GOWN DISPOSABLE) ×1 IMPLANT
GOWN STRL REUS W/TWL LRG LVL3 (GOWN DISPOSABLE) ×1
GOWN STRL REUS W/TWL XL LVL3 (GOWN DISPOSABLE) ×1
HANDPIECE INTERPULSE COAX TIP (DISPOSABLE) ×1
HOOD PEEL AWAY FLYTE STAYCOOL (MISCELLANEOUS) ×4 IMPLANT
IMMBOLIZER KNEE 19 BLUE UNIV (SOFTGOODS) ×2 IMPLANT
KIT RM TURNOVER STRD PROC AR (KITS) ×2 IMPLANT
KNEE MEDACTA TIBIAL/FEMORAL BL (Knees) ×2 IMPLANT
KNIFE SCULPS 14X20 (INSTRUMENTS) ×2 IMPLANT
NDL SAFETY 18GX1.5 (NEEDLE) ×2 IMPLANT
NEEDLE SPNL 18GX3.5 QUINCKE PK (NEEDLE) ×2 IMPLANT
NEEDLE SPNL 20GX3.5 QUINCKE YW (NEEDLE) ×2 IMPLANT
NS IRRIG 1000ML POUR BTL (IV SOLUTION) ×2 IMPLANT
PACK TOTAL KNEE (MISCELLANEOUS) ×2 IMPLANT
PAD WRAPON POLAR KNEE (MISCELLANEOUS) ×1 IMPLANT
SET HNDPC FAN SPRY TIP SCT (DISPOSABLE) ×1 IMPLANT
SOL .9 NS 3000ML IRR  AL (IV SOLUTION) ×1
SOL .9 NS 3000ML IRR UROMATIC (IV SOLUTION) ×1 IMPLANT
STAPLER SKIN PROX 35W (STAPLE) ×2 IMPLANT
SUCTION FRAZIER HANDLE 10FR (MISCELLANEOUS) ×1
SUCTION TUBE FRAZIER 10FR DISP (MISCELLANEOUS) ×1 IMPLANT
SUT DVC 2 QUILL PDO  T11 36X36 (SUTURE) ×1
SUT DVC 2 QUILL PDO T11 36X36 (SUTURE) ×1 IMPLANT
SUT DVC QUILL MONODERM 30X30 (SUTURE) ×2 IMPLANT
SYR 20CC LL (SYRINGE) ×2 IMPLANT
SYR 50ML LL SCALE MARK (SYRINGE) ×4 IMPLANT
TOWEL OR 17X26 4PK STRL BLUE (TOWEL DISPOSABLE) ×2 IMPLANT
TOWER CARTRIDGE SMART MIX (DISPOSABLE) ×2 IMPLANT
WRAPON POLAR PAD KNEE (MISCELLANEOUS) ×2

## 2016-06-01 NOTE — Progress Notes (Signed)
Patient tolerated sitting up in chair for a while. Transferred back to bed stand by assist. Polar care and foot pumps in place. Bone foam in place. Continue to monitor.

## 2016-06-01 NOTE — Evaluation (Signed)
Physical Therapy Evaluation Patient Details Name: Kathleen Olsen MRN: [id number] DOB: February 07, 1961 Today's Date: 06/01/2016   History of Present Illness  Pt underwent R TKR without reported post-op complications. PT evaluation performed on POD#0. No reported falls in the last 12 months.   Clinical Impression  Pt demonstrates excellent bed mobility, transfers, and ambulation for POD#0 R TKR. She demonstrates good stability with short ambulation from bed to recliner. Able to complete all supine exercises without increase in R knee pain. Right knee AAROM -5 to 94 degrees and flexion is limited only by dressings currently. Pt will be safe to return home with [facility] PT at discharge. Pt will benefit from skilled PT services to address deficits in strength, balance, and mobility in order to return to full function at home.     Follow Up Recommendations Home health PT    Equipment Recommendations  Rolling walker with 5" wheels    Recommendations for Other Services       Precautions / Restrictions Precautions Precautions: Knee Precaution Booklet Issued: Yes (comment) Restrictions Weight Bearing Restrictions: Yes RLE Weight Bearing: Weight bearing as tolerated      Mobility  Bed Mobility Overal bed mobility: Modified Independent             General bed mobility comments: Good speed, sequencing, and RLE strength noted. Pt reports minimal pain with bed mobility  Transfers Overall transfer level: Needs assistance Equipment used: Rolling walker (2 wheeled) Transfers: Sit to/from Stand Sit to Stand: Min guard         General transfer comment: Pt demonstrates decreased weight shift to RLE. Good R knee flexion noted in sitting. Good sequencing. Cues for safe hand placement  Ambulation/Gait Ambulation/Gait assistance: Min guard Ambulation Distance (Feet): 5 Feet Assistive device: Rolling walker (2 wheeled) Gait Pattern/deviations: Step-to pattern Gait velocity: Decreased Gait velocity  interpretation: <1.8 ft/sec, indicative of risk for recurrent falls General Gait Details: Pt ambulated from bed to recliner with cues for proper sequencing with walker. Good weight shift noted to RLE and minimal UE reliance on rolling walker. Safe with turns and no RLE buckling noted  Stairs            Wheelchair Mobility    Modified Rankin (Stroke Patients Only)       Balance Overall balance assessment: Needs assistance Sitting-balance support: No upper extremity supported Sitting balance-Leahy Scale: Normal     Standing balance support: No upper extremity supported Standing balance-Leahy Scale: Fair                               Pertinent Vitals/Pain Pain Assessment: 0-10 Pain Score: 4  Pain Location: R knee Pain Descriptors / Indicators: Nagging Pain Intervention(s): Limited activity within patient's tolerance;Monitored during session;Premedicated before session    Home Living Family/patient expects to be discharged to:: Private residence Living Arrangements: Spouse/significant other Available Help at Discharge: Family Type of Home: House Home Access: Stairs to enter Entrance Stairs-Rails: Can reach both Entrance Stairs-Number of Steps: 2 Home Layout: One level Home Equipment: Toilet riser;Cane - single point;Walker - 4 wheels;Shower seat      Prior Function Level of Independence: Independent         Comments: Full community ambulator without assistive device. Independent with ADLs/IADLs, drives     Hand Dominance   Dominant Hand: Right    Extremity/Trunk Assessment   Upper Extremity Assessment: Overall WFL for tasks assessed  Lower Extremity Assessment: RLE deficits/detail RLE Deficits / Details: Pt able to perform SLR and SAQ without assistance. Denies N/T in RLE. Full DF/PF       Communication   Communication: No difficulties  Cognition Arousal/Alertness: Awake/alert Behavior During Therapy: WFL for tasks  assessed/performed Overall Cognitive Status: Within Functional Limits for tasks assessed                      General Comments      Exercises Total Joint Exercises Ankle Circles/Pumps: Strengthening;Both;10 reps;Supine Quad Sets: Strengthening;Both;10 reps;Supine Gluteal Sets: Strengthening;Both;10 reps;Supine Towel Squeeze: Strengthening;Both;10 reps;Supine Short Arc Quad: Strengthening;Right;10 reps;Supine Heel Slides: Strengthening;Right;10 reps;Supine Hip ABduction/ADduction: Strengthening;Right;10 reps;Supine Straight Leg Raises: Strengthening;Right;10 reps;Supine Goniometric ROM: -5 to 94 AAROM, pain limited in extension. Limited by dressings for flexion      Assessment/Plan    PT Assessment Patient needs continued PT services  PT Diagnosis Difficulty walking;Abnormality of gait;Generalized weakness;Acute pain   PT Problem List Decreased strength;Decreased range of motion;Decreased activity tolerance;Decreased balance;Decreased mobility;Pain;Obesity  PT Treatment Interventions DME instruction;Gait training;Stair training;Therapeutic activities;Therapeutic exercise;Balance training;Neuromuscular re-education;Patient/family education;Manual techniques   PT Goals (Current goals can be found in the Care Plan section) Acute Rehab PT Goals Patient Stated Goal: Return to prior level of function at home PT Goal Formulation: With patient Time For Goal Achievement: 06/15/16 Potential to Achieve Goals: Good    Frequency BID   Barriers to discharge Inaccessible home environment 2 steps to enter but no railings    Co-evaluation               End of Session Equipment Utilized During Treatment: Gait belt Activity Tolerance: Patient tolerated treatment well Patient left: in chair;with call bell/phone within reach;with chair alarm set;with SCD's reapplied;Other (comment) (polar care in place, towel roll under heel)           Time: [id number] PT Time Calculation  (min) (ACUTE ONLY): 35 min   Charges:   PT Evaluation $PT Eval Low Complexity: 1 Procedure PT Treatments $Therapeutic Exercise: 8-22 mins   PT G Codes:       Lyndel Safe Huprich PT, DPT   Huprich,Jason 06/01/2016, 4:07 PM

## 2016-06-01 NOTE — H&P (Signed)
Reviewed paper H+P, will be scanned into chart. No changes noted.  

## 2016-06-01 NOTE — Anesthesia Procedure Notes (Signed)
Date/Time: 06/01/2016 7:32 AM Performed by: Johnna Acosta Pre-anesthesia Checklist: Patient identified, Emergency Drugs available, Suction available, Patient being monitored and Timeout performed Patient Re-evaluated:Patient Re-evaluated prior to inductionOxygen Delivery Method: Simple face mask

## 2016-06-01 NOTE — Care Management (Signed)
Patient has caregiver support at home.  Does not have a rolling walker.  Has a bedside commode. Has drug coverage and is anticipating discharging home on Lovenox injections for two week.s  Agency preference for home health PT/OT is [facility].  Agency preference for walker is [facility].  Referrals sent.  Anticipate discharge 8/17.  Street address is [place]

## 2016-06-01 NOTE — Progress Notes (Signed)
Patient resting in bed, no complaints at this time. Teds and AV1's in place. Foley intact and patent. Polar care in place. Pain medication given with relief. Will recheck patient within the hour. Continue to monitor.

## 2016-06-01 NOTE — Anesthesia Procedure Notes (Signed)
Spinal  Patient location during procedure: OR Start time: 06/01/2016 7:17 AM End time: 06/01/2016 7:22 AM Staffing Anesthesiologist: PENWARDEN, AMY Resident/CRNA: MICHELET, STEPHANIE Performed: anesthesiologist  Preanesthetic Checklist Completed: patient identified, site marked, surgical consent, pre-op evaluation, timeout performed, IV checked, risks and benefits discussed and monitors and equipment checked Spinal Block Patient position: sitting Prep: ChloraPrep Patient monitoring: heart rate, continuous pulse ox, blood pressure and cardiac monitor Approach: midline Location: L4-5 Injection technique: single-shot Needle Needle type: Whitacre and Introducer  Needle gauge: 25 G Needle length: 9 cm Additional Notes Negative paresthesia. Negative blood return. Positive free-flowing CSF. Expiration date of kit checked and confirmed. Patient tolerated procedure well, without complications.       

## 2016-06-01 NOTE — Anesthesia Postprocedure Evaluation (Addendum)
Anesthesia Post Note  Patient: Carley Hammed  Procedure(s) Performed: Procedure(s) (LRB): TOTAL KNEE ARTHROPLASTY (Right)  Patient location during evaluation: PACU Anesthesia Type: Spinal and MAC Level of consciousness: awake and alert and oriented Pain management: pain level controlled Vital Signs Assessment: post-procedure vital signs reviewed and stable Respiratory status: spontaneous breathing, nonlabored ventilation and respiratory function stable Cardiovascular status: blood pressure returned to baseline and stable Postop Assessment: no signs of nausea or vomiting, no backache, no headache and spinal receding Anesthetic complications: no    Last Vitals:  Vitals:   06/01/16 1006 06/01/16 1033  BP: 125/62 128/70  Pulse: 88 94  Resp: 15 18  Temp: 36.9 C 36.5 C    Last Pain:  Vitals:   06/01/16 1030  TempSrc:   PainSc: 0-No pain                 Calvina Liptak

## 2016-06-01 NOTE — Addendum Note (Signed)
Addendum  created 06/01/16 1053 by Emmie Niemann, MD   Sign clinical note

## 2016-06-01 NOTE — Anesthesia Preprocedure Evaluation (Addendum)
Anesthesia Evaluation  Patient identified by MRN, date of birth, ID band Patient awake    Reviewed: Allergy & Precautions, NPO status , Patient's Chart, lab work & pertinent test results  History of Anesthesia Complications Negative for: history of anesthetic complications  Airway Mallampati: II  TM Distance: >3 FB Neck ROM: Full    Dental  (+) Caps   Pulmonary neg pulmonary ROS, neg shortness of breath, asthma (mild intermittent ) , neg sleep apnea,    breath sounds clear to auscultation- rhonchi (-) wheezing      Cardiovascular Exercise Tolerance: Good hypertension, (-) CAD and (-) Past MI  Rhythm:Regular Rate:Normal - Systolic murmurs and - Diastolic murmurs    Neuro/Psych Depression negative neurological ROS     GI/Hepatic negative GI ROS, Neg liver ROS,   Endo/Other  negative endocrine ROSneg diabetes  Renal/GU negative Renal ROS     Musculoskeletal  (+) Arthritis , Osteoarthritis,    Abdominal (+) + obese,   Peds  Hematology negative hematology ROS (+)   Anesthesia Other Findings Past Medical History: No date: Depression No date: Hyperlipidemia No date: Hypertension No date: Obesity, unspecified No date: Other specified menopausal and postmenopausal *   Reproductive/Obstetrics                            Anesthesia Physical Anesthesia Plan  ASA: II  Anesthesia Plan: Spinal and MAC   Post-op Pain Management:    Induction:   Airway Management Planned: Natural Airway  Additional Equipment:   Intra-op Plan:   Post-operative Plan:   Informed Consent: I have reviewed the patients History and Physical, chart, labs and discussed the procedure including the risks, benefits and alternatives for the proposed anesthesia with the patient or authorized representative who has indicated his/her understanding and acceptance.   Dental advisory given  Plan Discussed with:  Anesthesiologist and CRNA  Anesthesia Plan Comments:        Anesthesia Quick Evaluation

## 2016-06-01 NOTE — Plan of Care (Signed)
Problem: Education: Goal: Knowledge of [facility] Education information/materials will improve Outcome: Completed/Met Date Met: 06/01/16 Patient educated on plan of care, postoperative plan of care, and, discharge planning. Pain control also explained to patient. Patient verbalizes understanding.   Problem: Safety: Goal: Ability to remain free from injury will improve Outcome: Completed/Met Date Met: 06/01/16 Patient calls out for assist. Remains free from injury.

## 2016-06-01 NOTE — Transfer of Care (Signed)
Immediate Anesthesia Transfer of Care Note  Patient: Kathleen Olsen  Procedure(s) Performed: Procedure(s): TOTAL KNEE ARTHROPLASTY (Right)  Patient Location: PACU  Anesthesia Type:Spinal  Level of Consciousness: awake, alert  and oriented  Airway & Oxygen Therapy: Patient Spontanous Breathing  Post-op Assessment: Report given to RN and Post -op Vital signs reviewed and stable  Post vital signs: Reviewed and stable  Last Vitals:  Vitals:   06/01/16 0607 06/01/16 0920  BP: (!) 159/89 (!) 149/57  Pulse: (!) 106 97  Resp: 18 10  Temp: 36.4 C 36.8 C    Last Pain:  Vitals:   06/01/16 0920  TempSrc: Tympanic  PainSc:          Complications: No apparent anesthesia complications

## 2016-06-01 NOTE — Op Note (Signed)
06/01/2016  9:22 AM  PATIENT:  Kathleen Olsen  55 y.o. female  PRE-OPERATIVE DIAGNOSIS:  primary osteoarthritis right knee  POST-OPERATIVE DIAGNOSIS:  primary osteoarthritis right knee  PROCEDURE:  Procedure(s): TOTAL KNEE ARTHROPLASTY (Right)  SURGEON: Laurene Footman, MD  ASSISTANTS: Rachelle Hora [facility]  ANESTHESIA:   spinal  EBL:  Total I/O In: 800 [I.V.:800] Out: 200 [Urine:100; Blood:100]  BLOOD ADMINISTERED:none  DRAINS: none   LOCAL MEDICATIONS USED:  MARCAINE    and OTHER exparel, morphine and Toradol  SPECIMEN:  No Specimen  DISPOSITION OF SPECIMEN:  N/A  COUNTS:  YES  TOURNIQUET:   67 minutes at 300 mmHg  IMPLANTS: Medacta GMK sphere 4 femur with 3Ti4 tibial baseplate with a short stem 14 mm plastic insert and 2 patella, all components cemented  DICTATION: .Dragon Dictation patient brought the operating room and after adequate anesthesia was obtained the right leg was prepped and draped in sterile fashion was turned by the upper thigh. After patient identification and timeout procedures were completed, tourniquet was raised and a midline skin incision was made followed by medial parapatellar arthrotomy with inspection revealing exposed bone in the medial compartment on both tibia and femoral sides as well as in the femoral trochlea and patellar surface lateral compartment relatively spared. The anterior cruciate ligament and PCL were cut along the fat pad and proximal tibia was exposed for application the [place] cutting guide, with proximal tibia cut carried out. The proximal tibial bone was removed and then the distal femoral guide was applied distal femoral cut made followed by the 4-in-1 cutting guide. This point the residual posterior horns of the menisci could be excised. The 3 Tibia baseplate was placed with a 4 insert and preparation carried out with the proximal reaming and placing the keel punch. Next the size 4 femur was impacted and a 14 mm insert gave  excellent stability through range of motion distal femoral drill holes were made and the notch cut then carried out in the trochlear groove. Trials were removed and the patella was cut using the patellar cutting guide sized to a size 2 after drilling holes were made trigger was let down and there is no significant bleeding at this point the tourniquet was raised again injection of the above medicines was placed with the knee then thoroughly irrigated and dried. Tibial component was cemented into place first followed by snapping in the polyethylene insert with set screw using a torque screwdriver. Femoral component was then impacted in place and the knee held in extension with excess cement removed the patellar button was clamped into place. After the cemented set the knee was thoroughly irrigated tourniquet let down and the arthrotomy repaired using heavy Quill with good tracking of the patella and stability. To a Quill substantially and skin staples. Xeroform 4 x 4's web roll Ace wrap Polar Care applied  PLAN OF CARE: Admit to inpatient   PATIENT DISPOSITION:  PACU - hemodynamically stable.

## 2016-06-02 ENCOUNTER — Encounter: Payer: Self-pay | Admitting: Orthopedic Surgery

## 2016-06-02 LAB — BASIC METABOLIC PANEL
ANION GAP: 9 (ref 5–15)
BUN: 13 mg/dL (ref 6–20)
CALCIUM: 8.1 mg/dL — AB (ref 8.9–10.3)
CO2: 28 mmol/L (ref 22–32)
Chloride: 97 mmol/L — ABNORMAL LOW (ref 101–111)
Creatinine, Ser: 0.91 mg/dL (ref 0.44–1.00)
GFR calc non Af Amer: >60 mL/min (ref >60)
GLUCOSE: 127 mg/dL — AB (ref 65–99)
POTASSIUM: 3.2 mmol/L — AB (ref 3.5–5.1)
Sodium: 134 mmol/L — ABNORMAL LOW (ref 135–145)

## 2016-06-02 LAB — CBC
HEMATOCRIT: 31.8 % — AB (ref 35.0–47.0)
HEMOGLOBIN: 10.6 g/dL — AB (ref 12.0–16.0)
MCH: 29.9 pg (ref 26.0–34.0)
MCHC: 33.2 g/dL (ref 32.0–36.0)
MCV: 90.3 fL (ref 80.0–100.0)
Platelets: 277 10*3/uL (ref 150–440)
RBC: 3.53 MIL/uL — AB (ref 3.80–5.20)
RDW: 12.7 % (ref 11.5–14.5)
WBC: 12.8 10*3/uL — AB (ref 3.6–11.0)

## 2016-06-02 MED ORDER — POTASSIUM CHLORIDE 20 MEQ PO PACK
40.0000 meq | PACK | Freq: Two times a day (BID) | ORAL | Status: DC
  Administered 2016-06-02: 40 meq via ORAL
  Filled 2016-06-02: qty 2

## 2016-06-02 MED ORDER — POTASSIUM CHLORIDE CRYS ER 20 MEQ PO TBCR: 20.0000 meq | EXTENDED_RELEASE_TABLET | Freq: Two times a day (BID) | ORAL | Status: DC

## 2016-06-02 NOTE — Progress Notes (Signed)
Physical Therapy Treatment Patient Details Name: Kathleen Olsen MRN: [id number] DOB: 22-Apr-1961 Today's Date: 06/02/2016    History of Present Illness Pt underwent R TKR without reported post-op complications. PT evaluation performed on POD#0. No reported falls in the last 12 months.     PT Comments    Pt agreeable to PT. Pt reports performing stretching to R knee flexion and extension throughout the day; notes mild increased soreness. Pt educated again on moderation and using trending pain as an activity/exercise guide. Pt demonstrates improved use of Right lower extremity with transfers and able to transfer on/off bed, commode and wheelchair with supervision. Pt also demonstrates improved ambulation distance and quality this afternoon without adverse symptoms during/post ambulation. Pt negotiated up/down 4 stairs with bilateral handrails and good understanding/demonstration of sequence/safety. Pt returned to bed post session. Continue PT to progress range, strength, endurance to improve all functional mobility to allow for an optimal, safe return home post acute care stay.    Follow Up Recommendations  Home health PT     Equipment Recommendations  Rolling walker with 5" wheels    Recommendations for Other Services       Precautions / Restrictions Precautions Precautions: Knee Restrictions Weight Bearing Restrictions: Yes RLE Weight Bearing: Weight bearing as tolerated    Mobility  Bed Mobility Overal bed mobility: Modified Independent                Transfers Overall transfer level: Needs assistance Equipment used: Rolling walker (2 wheeled) Transfers: Sit to/from Stand Sit to Stand: Supervision         General transfer comment: Improved use of RLE; transfer STS from bed, commode and w/c  Ambulation/Gait Ambulation/Gait assistance: Min guard;Supervision Ambulation Distance (Feet): 150 Feet (also 25 feet to bathroom) Assistive device: Rolling walker (2  wheeled) Gait Pattern/deviations: Step-through pattern;Decreased stance time - right;Decreased step length - left;Antalgic Gait velocity: Decreased (but too quick for allowing improved technique/quality) Gait velocity interpretation: Below normal speed for age/gender General Gait Details: Demonstrates improved fluidity/cadence this afternoon, although continues with antalgia on R and mild pause with weightbearing over RLE   Stairs Stairs: Yes Stairs assistance: Min guard Stair Management: Two rails;Forwards Number of Stairs: 4 General stair comments: Initial education on sequence; performs well  Wheelchair Mobility    Modified Rankin (Stroke Patients Only)       Balance   Sitting-balance support: Feet supported Sitting balance-Leahy Scale: Normal     Standing balance support: Bilateral upper extremity supported Standing balance-Leahy Scale: Fair                      Cognition Arousal/Alertness: Awake/alert Behavior During Therapy: WFL for tasks assessed/performed Overall Cognitive Status: Within Functional Limits for tasks assessed                      Exercises Other Exercises Other Exercises: toilet supervision/setup Other Exercises: notes performance of flexion and extension stretching throughout day    General Comments        Pertinent Vitals/Pain Pain Assessment: 0-10 Pain Score: 5  Pain Location: R knee Pain Descriptors / Indicators: Discomfort;Sore Pain Intervention(s): Monitored during session;Premedicated before session;Ice applied    Home Living                      Prior Function            PT Goals (current goals can now be found in the care plan section)  Acute Rehab PT Goals Patient Stated Goal: Return to prior level of function at home Progress towards PT goals: Progressing toward goals    Frequency  BID    PT Plan Current plan remains appropriate    Co-evaluation             End of Session Equipment  Utilized During Treatment: Gait belt Activity Tolerance: Patient tolerated treatment well Patient left: with call bell/phone within reach;Other (comment);in bed (polar care in place; refused alarm)     Time: [id number] PT Time Calculation (min) (ACUTE ONLY): 35 min  Charges:  $Gait Training: 8-22 mins $Therapeutic Activity: 8-22 mins                    G Codes:      Charlaine Dalton, PTA 06/02/2016, 3:49 PM

## 2016-06-02 NOTE — Care Management (Signed)
Lovenox PA form completed and faxed to [facility].  Patient will not require OT  home health.  Copay for Lovenox will be 25 dollars.  This will not be cost prohibitive for patient.

## 2016-06-02 NOTE — Care Management (Signed)
Spoke with Rachelle Hora, PA and informed that even though patient is on 30 mg in the hospital, will discharge home on 40mg  qd x 14 days. Called script for Lovenox (enoxaparin) 40mg  sq qd x 14 days.  Informed that medication will require prior authorization.  Spoke with  Blondell Reveal at [employer] [phone]).  Prior approval form to be faxed to unit and will notified attending and his PA if urgent need for signature.  Informed that patient's copay will be 25 dollars.

## 2016-06-02 NOTE — Progress Notes (Signed)
Physical Therapy Treatment Patient Details Name: Kathleen Olsen MRN: [id number] DOB: 05/22/1961 Today's Date: 06/02/2016    History of Present Illness Pt underwent R TKR without reported post-op complications. PT evaluation performed on POD#0. No reported falls in the last 12 months.     PT Comments    Pt eager for PT. Pain right knee tolerable. Pt educated and participated in flexion/extension stretching including repetitions, hold duration, frequency and carry over techniques for home use; spouse educated as well. Pt/spouse educated on open versus closed knee position for joint edema control. Pt demonstrates decreased use of Right lower extremity with sit/to from stand transfers, but safe/stable. Pt mildly impulsive with attempt to ambulate immediately; encouraged weight shifting with quad set on right 10 times pre gait. Pt progressing ambulation distance, but requires heavy cueing for improved technique and slowing speed to allow for improved quality/control. Pt able to demonstrate improved reciprocal pattern with consistent cues. Pt does become fatigued and diaphoretic quickly after 65 ft and encouraged to turn to return to room. Pt ambulates back successfully with heart rate and O2 saturation levels within normal levels and no other adverse symptoms. Pt up in chair comfortably. Continue PT this afternoon to progress strength, endurance and quality of functional mobility to allow an optimal, safe return home. Attempt stair climbing as tolerated.    Follow Up Recommendations  Home health PT     Equipment Recommendations  Rolling walker with 5" wheels    Recommendations for Other Services       Precautions / Restrictions Precautions Precautions: Knee Restrictions Weight Bearing Restrictions: Yes RLE Weight Bearing: Weight bearing as tolerated    Mobility  Bed Mobility               General bed mobility comments: Not tested; up in chair   Transfers Overall transfer level:  Needs assistance Equipment used: Rolling walker (2 wheeled) Transfers: Sit to/from Stand Sit to Stand: Min guard         General transfer comment: limited use of RLE during transfer  Ambulation/Gait Ambulation/Gait assistance: Min guard Ambulation Distance (Feet): 130 Feet Assistive device: Rolling walker (2 wheeled) Gait Pattern/deviations: Step-to pattern;Step-through pattern;Decreased step length - left;Decreased stance time - right;Decreased dorsiflexion - right;Decreased weight shift to right;Antalgic (decreased hip/knee flexion on R) Gait velocity: Decreased (but too quick for allowing improved technique/quality) Gait velocity interpretation: Below normal speed for age/gender General Gait Details: Pt mildly impulsive and attempts to ambulate quickly sacrificing quality. Encouraged decreased pace and encouraged reciprocal pattern decreasing R step length and increasing L with 3 point contact technique. Pt becomes fatigued and diaphoretic at 22 ft and instructed to turn around to avoid over work.    Stairs            Wheelchair Mobility    Modified Rankin (Stroke Patients Only)       Balance Overall balance assessment: Needs assistance Sitting-balance support: Feet supported Sitting balance-Leahy Scale: Normal     Standing balance support: Bilateral upper extremity supported Standing balance-Leahy Scale: Fair                      Cognition Arousal/Alertness: Awake/alert Behavior During Therapy: WFL for tasks assessed/performed Overall Cognitive Status: Within Functional Limits for tasks assessed                      Exercises Total Joint Exercises Ankle Circles/Pumps: AROM;Both;20 reps (long sit) Quad Sets: Strengthening;Both;20 reps (long sit with R ankle  roll; 10x in stand pre gait) Knee Flexion: AROM;AAROM;Right;10 reps;Seated (3 positions each rep with 10 second hold) Goniometric ROM: 4 to 96 degrees    General Comments         Pertinent Vitals/Pain Pain Assessment: 0-10 Pain Score: 4  Pain Location: R knee Pain Intervention(s): Premedicated before session;Monitored during session;Ice applied    Home Living                      Prior Function            PT Goals (current goals can now be found in the care plan section) Progress towards PT goals: Progressing toward goals    Frequency  BID    PT Plan Current plan remains appropriate    Co-evaluation             End of Session Equipment Utilized During Treatment: Gait belt Activity Tolerance: Patient tolerated treatment well Patient left: in chair;with call bell/phone within reach;with family/visitor present;Other (comment) (polar care in place; refused alarm)     Time: [id number] PT Time Calculation (min) (ACUTE ONLY): 25 min  Charges:  $Gait Training: 8-22 mins $Therapeutic Exercise: 8-22 mins                    G Codes:      Charlaine Dalton, PTA 06/02/2016, 11:09 AM

## 2016-06-02 NOTE — Progress Notes (Signed)
Clinical Social Worker (CSW) received SNF consult. PT is recommending home health. RN Case Manager is aware of above. Please reconsult if future social work needs arise. CSW signing off.   Cleon Signorelli, LCSW [phone] 

## 2016-06-02 NOTE — Progress Notes (Signed)
   Subjective: 1 Day Post-Op Procedure(s) (LRB): TOTAL KNEE ARTHROPLASTY (Right) Patient reports pain as mild.   Patient is well, and has had no acute complaints or problems Denies any CP, SOB, ABD pain. We will continue therapy today.  Plan is to go Home after hospital stay.  Objective: Vital signs in last 24 hours: Temp:  [97.5 F (36.4 C)-98.8 F (37.1 C)] 98.8 F (37.1 C) (08/16 0412) Pulse Rate:  [67-97] 90 (08/16 0412) Resp:  [15-19] 16 (08/16 0412) BP: (106-149)/(53-95) 116/58 (08/16 0412) SpO2:  [91 %-100 %] 95 % (08/16 0412)  Intake/Output from previous day: 08/15 0701 - 08/16 0700 In: 3298.8 [P.O.:1320; I.V.:1878.8; IV Piggyback:100] Out: [id number]; Blood:100] Intake/Output this shift: No intake/output data recorded.   Recent Labs  06/01/16 1304 06/02/16 0514  HGB 8.7* 10.6*    Recent Labs  06/01/16 1304 06/02/16 0514  WBC 8.9 12.8*  RBC 2.87* 3.53*  HCT 25.7* 31.8*  PLT 198 277    Recent Labs  06/01/16 1304 06/02/16 0514  NA  --  134*  K  --  3.2*  CL  --  97*  CO2  --  28  BUN  --  13  CREATININE 0.40* 0.91  GLUCOSE  --  127*  CALCIUM  --  8.1*   No results for input(s): LABPT, INR in the last 72 hours.  EXAM General - Patient is Alert, Appropriate and Oriented Extremity - Neurovascular intact Sensation intact distally Intact pulses distally Dorsiflexion/Plantar flexion intact No cellulitis present Compartment soft Dressing - dressing C/D/I and no drainage.  Motor Function - intact, moving foot and toes well on exam.   Past Medical History:  Diagnosis Date  . Depression   . Hyperlipidemia   . Hypertension   . Obesity, unspecified   . Other specified menopausal and postmenopausal disorder     Assessment/Plan:   1 Day Post-Op Procedure(s) (LRB): TOTAL KNEE ARTHROPLASTY (Right) Active Problems:   Primary localized osteoarthritis of right knee   Acute post op blood loss anemia    Hypokalemia    Estimated body mass  index is 36.02 kg/m as calculated from the following:   Height as of this encounter: 5\' 7"  (1.702 m).   Weight as of this encounter: 104.3 kg (230 lb). Advance diet Up with therapy Needs BM Klor kon 40 meq  BID, recheck labs in the am Will recheck Hgb in the am CM to assist with discharge planning.   DVT Prophylaxis - Lovenox, Foot Pumps and TED hose Weight-Bearing as tolerated to right leg   T. Rachelle Hora, PA-C [facility] 06/02/2016, 7:52 AM

## 2016-06-02 NOTE — Progress Notes (Signed)
Patient is A&O x4. Up with assist x1. Urinating without difficultly. Dressing to right knee intact. Bone form in place. VSS. Gave oral pain meds with relief x2.

## 2016-06-02 NOTE — Evaluation (Signed)
Occupational Therapy Evaluation Patient Details Name: Kathleen Olsen MRN: [id number] DOB: 1961-01-24 Today's Date: 06/02/2016    History of Present Illness Pt underwent R TKR without reported post-op complications.    Clinical Impression   Pt  Is 55 year old female s/p R TKR who lives at home with her husband who is retired. She is currently doing well with bed mobility to sit EOB for ADLs and for transfer from bed to chair with supervision.  She is able to use reacher and sock aid with mod cues and occasional assist for positioning of sock.  She presents with good balance for simulated pants over hips standing bedside.  Revewed safe mobility using FWW using a walker bag to carry items and prevent falls.  Also rec pt's husband remove large rug in living room to prevent tripping and falls. She has a transfer tub bench she can borrow from her brother for showering when cleared by MD.  Rec continued OT for ADL retraining with or without AD.      Follow Up Recommendations  No OT follow up    Equipment Recommendations       Recommendations for Other Services       Precautions / Restrictions Precautions Precautions: Knee Restrictions Weight Bearing Restrictions: Yes RLE Weight Bearing: Weight bearing as tolerated      Mobility Bed Mobility               General bed mobility comments: Not tested; up in chair   Transfers Overall transfer level: Needs assistance Equipment used: Rolling walker (2 wheeled) Transfers: Sit to/from Stand Sit to Stand: Min guard         General transfer comment: limited use of RLE during transfer    Balance Overall balance assessment: Needs assistance Sitting-balance support: Feet supported Sitting balance-Leahy Scale: Normal     Standing balance support: Bilateral upper extremity supported Standing balance-Leahy Scale: Fair                              ADL Overall ADL's : Needs assistance/impaired                                        General ADL Comments: Pt currently doing well with bed mobility to sit EOB for ADLs and for transfer from bed to chair with supervision.  She is able to use reacher and sock aid with mod cues and occasional assist for positioning of sock.  She presents with good balance for simulated pants over hips standing bedside.  Revewed safe mobility using FWW using a walker bag to carry items and prevent falls.  Also rec pt's husband remove large rug in living room to prevent tripping and falls. She has a transfer tub bench she can borrow from her brother for showering when cleared by MD.       Vision     Perception     Praxis      Pertinent Vitals/Pain Pain Assessment: 0-10 Pain Score: 4  Pain Location: R knee Pain Descriptors / Indicators: Aching Pain Intervention(s): Limited activity within patient's tolerance;Premedicated before session;Repositioned;Monitored during session     Hand Dominance Right   Extremity/Trunk Assessment Upper Extremity Assessment Upper Extremity Assessment: Overall WFL for tasks assessed   Lower Extremity Assessment Lower Extremity Assessment: Defer to PT evaluation  Communication Communication Communication: No difficulties   Cognition Arousal/Alertness: Awake/alert Behavior During Therapy: WFL for tasks assessed/performed Overall Cognitive Status: Within Functional Limits for tasks assessed                     General Comments       Exercises Exercises: Total Joint     Shoulder Instructions      Home Living Family/patient expects to be discharged to:: Private residence Living Arrangements: Spouse/significant other Available Help at Discharge: Family Type of Home: House Home Access: Stairs to enter Technical brewer of Steps: 2 Entrance Stairs-Rails: Can reach both Home Layout: One level     Bathroom Shower/Tub: Tub/shower unit Shower/tub characteristics: Architectural technologist:  Handicapped height Bathroom Accessibility: Yes How Accessible: Accessible via walker Home Equipment: Toilet riser;Cane - single point;Walker - 4 wheels;Shower seat   Additional Comments: Pt has a transfer tub bench she can use from her brother for showering      Prior Functioning/Environment Level of Independence: Independent        Comments: Full community ambulator without assistive device. Independent with ADLs/IADLs, drives    OT Diagnosis: Acute pain   OT Problem List: Decreased strength;Decreased range of motion;Decreased activity tolerance;Decreased knowledge of use of DME or AE;Pain   OT Treatment/Interventions: Self-care/ADL training;DME and/or AE instruction;Patient/family education    OT Goals(Current goals can be found in the care plan section) Acute Rehab OT Goals Patient Stated Goal: Return to prior level of function at home OT Goal Formulation: With patient/family Time For Goal Achievement: 06/16/16 Potential to Achieve Goals: Good ADL Goals Pt Will Perform Lower Body Dressing: with supervision;with adaptive equipment;sit to/from stand (with or without AD and no LOB) Pt Will Transfer to Toilet: with supervision;stand pivot transfer;regular height toilet (using FWW and no LOB)  OT Frequency: Min 1X/week   Barriers to D/C:            Co-evaluation              End of Session    Activity Tolerance: Patient tolerated treatment well Patient left: in chair (PT had arrived at end of session)   Time: 0915-0950 OT Time Calculation (min): 35 min Charges:  OT General Charges $OT Visit: 1 Procedure OT Evaluation $OT Eval Low Complexity: 1 Procedure OT Treatments $Self Care/Home Management : 8-22 mins G-Codes:     Chrys Racer, OTR/L ascom [phone] 06/02/16, 11:57 AM

## 2016-06-03 LAB — BASIC METABOLIC PANEL
Anion gap: 7 (ref 5–15)
BUN: 9 mg/dL (ref 6–20)
CALCIUM: 8 mg/dL — AB (ref 8.9–10.3)
CO2: 29 mmol/L (ref 22–32)
CREATININE: 0.62 mg/dL (ref 0.44–1.00)
Chloride: 93 mmol/L — ABNORMAL LOW (ref 101–111)
GFR calc Af Amer: >60 mL/min (ref >60)
GLUCOSE: 141 mg/dL — AB (ref 65–99)
Potassium: 3.3 mmol/L — ABNORMAL LOW (ref 3.5–5.1)
SODIUM: 129 mmol/L — AB (ref 135–145)

## 2016-06-03 LAB — CBC
HCT: 30.4 % — ABNORMAL LOW (ref 35.0–47.0)
Hemoglobin: 10.6 g/dL — ABNORMAL LOW (ref 12.0–16.0)
MCH: 31 pg (ref 26.0–34.0)
MCHC: 34.8 g/dL (ref 32.0–36.0)
MCV: 89 fL (ref 80.0–100.0)
PLATELETS: 267 10*3/uL (ref 150–440)
RBC: 3.41 MIL/uL — ABNORMAL LOW (ref 3.80–5.20)
RDW: 13.1 % (ref 11.5–14.5)
WBC: 11.7 10*3/uL — AB (ref 3.6–11.0)

## 2016-06-03 MED ORDER — POTASSIUM CHLORIDE 20 MEQ PO PACK
40.0000 meq | PACK | Freq: Two times a day (BID) | ORAL | Status: AC
  Administered 2016-06-03 (×2): 40 meq via ORAL
  Filled 2016-06-03 (×2): qty 2

## 2016-06-03 NOTE — Progress Notes (Signed)
Physical Therapy Treatment Patient Details Name: Kathleen Olsen MRN: [id number] DOB: 22-Jun-1961 Today's Date: 06/03/2016    History of Present Illness Pt underwent R TKR without reported post-op complications. PT evaluation performed on POD#0. No reported falls in the last 12 months.     PT Comments    Sitting edge of bed upon arrival.  Ambulated around nursing unit with supervision and rolling walker.  Stairs deferred today as she had just had milk of mag and was nervous about needing to use the bathroom.  Pt did not stairs yesterday and navigated them well and reported feeling confident.  To bathroom upon return to room but no BM noted.  May want to review stairs in am prior to d/c.   Follow Up Recommendations  Home health PT     Equipment Recommendations  Rolling walker with 5" wheels    Recommendations for Other Services       Precautions / Restrictions Precautions Precautions: Knee Restrictions Weight Bearing Restrictions: Yes RLE Weight Bearing: Weight bearing as tolerated    Mobility  Bed Mobility Overal bed mobility: Modified Independent             General bed mobility comments: slow but able to manage without asssit  Transfers Overall transfer level: Needs assistance Equipment used: Rolling walker (2 wheeled) Transfers: Sit to/from Stand Sit to Stand: Supervision            Ambulation/Gait Ambulation/Gait assistance: Min guard Ambulation Distance (Feet): 150 Feet Assistive device: Rolling walker (2 wheeled) Gait Pattern/deviations: Step-to pattern Gait velocity: Decreased Gait velocity interpretation: Below normal speed for age/gender General Gait Details: occasional self initiated rest breaks but no lob   Stairs            Wheelchair Mobility    Modified Rankin (Stroke Patients Only)       Balance Overall balance assessment: Needs assistance Sitting-balance support: Feet supported Sitting balance-Leahy Scale: Normal      Standing balance support: Bilateral upper extremity supported Standing balance-Leahy Scale: Fair                      Cognition Arousal/Alertness: Awake/alert Behavior During Therapy: WFL for tasks assessed/performed Overall Cognitive Status: Within Functional Limits for tasks assessed                      Exercises Other Exercises Other Exercises: toilet supervision.  no BM Other Exercises: seated LAQ x 15    General Comments        Pertinent Vitals/Pain Pain Assessment: 0-10 Pain Score: 6  Pain Location: R knee Pain Descriptors / Indicators: Aching;Discomfort Pain Intervention(s): Limited activity within patient's tolerance;Ice applied    Home Living                      Prior Function            PT Goals (current goals can now be found in the care plan section) Progress towards PT goals: Progressing toward goals    Frequency  BID    PT Plan Current plan remains appropriate    Co-evaluation             End of Session Equipment Utilized During Treatment: Gait belt Activity Tolerance: Patient tolerated treatment well Patient left: with call bell/phone within reach;Other (comment);in bed     Time: [id number] PT Time Calculation (min) (ACUTE ONLY): 15 min  Charges:  $Gait Training: 8-22 mins $Therapeutic Activity: 8-22  mins                    G Codes:      Chesley Noon 06-09-16, 2:20 PM

## 2016-06-03 NOTE — Progress Notes (Signed)
Physical Therapy Treatment Patient Details Name: Kathleen Olsen MRN: [id number] DOB: Aug 03, 1961 Today's Date: 06/03/2016    History of Present Illness Pt underwent R TKR without reported post-op complications. PT evaluation performed on POD#0. No reported falls in the last 12 months.     PT Comments    In bed, agrees to session. Participated in exercises as described below.  To edge of bed with use of rails and increased time.  She as able to stand and ambulate around nursing unit with min guard.  No loss of balance noted. Nausea relieved with mobility/time.     Follow Up Recommendations  Home health PT     Equipment Recommendations       Recommendations for Other Services       Precautions / Restrictions Precautions Precautions: Knee Restrictions Weight Bearing Restrictions: Yes RLE Weight Bearing: Weight bearing as tolerated    Mobility  Bed Mobility Overal bed mobility: Modified Independent             General bed mobility comments: slow but able to manage without asssit  Transfers Overall transfer level: Needs assistance Equipment used: Rolling walker (2 wheeled) Transfers: Sit to/from Stand Sit to Stand: Min guard            Ambulation/Gait Ambulation/Gait assistance: Min guard Ambulation Distance (Feet): 150 Feet Assistive device: Rolling walker (2 wheeled) Gait Pattern/deviations: Step-to pattern Gait velocity: Decreased Gait velocity interpretation: Below normal speed for age/gender General Gait Details: occasional self initiated rest breaks but no lob   Stairs            Wheelchair Mobility    Modified Rankin (Stroke Patients Only)       Balance Overall balance assessment: Needs assistance Sitting-balance support: Feet supported Sitting balance-Leahy Scale: Normal     Standing balance support: Bilateral upper extremity supported Standing balance-Leahy Scale: Fair                      Cognition Arousal/Alertness:  Awake/alert Behavior During Therapy: WFL for tasks assessed/performed Overall Cognitive Status: Within Functional Limits for tasks assessed                      Exercises Total Joint Exercises Ankle Circles/Pumps: AROM;Both;20 reps Quad Sets: Strengthening;Both;20 reps Gluteal Sets: Strengthening;Both;10 reps;Supine Towel Squeeze: Strengthening;Both;10 reps;Supine Short Arc Quad: Strengthening;Right;10 reps;Supine Heel Slides: Strengthening;Right;10 reps;Supine Hip ABduction/ADduction: Strengthening;Right;10 reps;Supine Straight Leg Raises: Strengthening;Right;10 reps;Supine Knee Flexion: AROM;AAROM;Right;10 reps;Seated Goniometric ROM: 3-96    General Comments        Pertinent Vitals/Pain Pain Assessment: 0-10 Pain Score: 7  Pain Location: R knee Pain Descriptors / Indicators: Aching;Discomfort Pain Intervention(s): Limited activity within patient's tolerance;Ice applied;Patient requesting pain meds-RN notified    Home Living                      Prior Function            PT Goals (current goals can now be found in the care plan section) Acute Rehab PT Goals Patient Stated Goal: Return to prior level of function at home    Frequency  BID    PT Plan Current plan remains appropriate    Co-evaluation             End of Session Equipment Utilized During Treatment: Gait belt Activity Tolerance: Patient tolerated treatment well Patient left: with call bell/phone within reach;Other (comment);in bed     Time: [id number] PT Time Calculation (  min) (ACUTE ONLY): 26 min  Charges:  $Gait Training: 8-22 mins $Therapeutic Exercise: 8-22 mins                    G Codes:      Chesley Noon 2016-07-01, 10:39 AM

## 2016-06-03 NOTE — Progress Notes (Signed)
   Subjective: 2 Days Post-Op Procedure(s) (LRB): TOTAL KNEE ARTHROPLASTY (Right) Patient reports pain as mild.   Patient is well, and has had no acute complaints or problems Denies any CP, SOB, ABD pain. We will continue therapy today.  Plan is to go Home after hospital stay.  Objective: Vital signs in last 24 hours: Temp:  [98 F (36.7 C)-98.7 F (37.1 C)] 98.7 F (37.1 C) (08/17 0401) Pulse Rate:  [76-90] 90 (08/17 0401) Resp:  [16] 16 (08/17 0401) BP: (109-149)/(53-76) 149/61 (08/17 0401) SpO2:  [93 %-97 %] 95 % (08/17 0401)  Intake/Output from previous day: 08/16 0701 - 08/17 0700 In: 480 [P.O.:480] Out: -  Intake/Output this shift: No intake/output data recorded.   Recent Labs  06/01/16 1304 06/02/16 0514 06/03/16 0413  HGB 8.7* 10.6* 10.6*    Recent Labs  06/02/16 0514 06/03/16 0413  WBC 12.8* 11.7*  RBC 3.53* 3.41*  HCT 31.8* 30.4*  PLT 277 267    Recent Labs  06/02/16 0514 06/03/16 0413  NA 134* 129*  K 3.2* 3.3*  CL 97* 93*  CO2 28 29  BUN 13 9  CREATININE 0.91 0.62  GLUCOSE 127* 141*  CALCIUM 8.1* 8.0*   No results for input(s): LABPT, INR in the last 72 hours.  EXAM General - Patient is Alert, Appropriate and Oriented Extremity - Neurovascular intact Sensation intact distally Intact pulses distally Dorsiflexion/Plantar flexion intact No cellulitis present Compartment soft Dressing - dressing C/D/I and dressing changed.  Motor Function - intact, moving foot and toes well on exam.   Past Medical History:  Diagnosis Date  . Depression   . Hyperlipidemia   . Hypertension   . Obesity, unspecified   . Other specified menopausal and postmenopausal disorder     Assessment/Plan:   2 Days Post-Op Procedure(s) (LRB): TOTAL KNEE ARTHROPLASTY (Right) Active Problems:   Primary localized osteoarthritis of right knee   Acute post op blood loss anemia    Hypokalemia     Hyponatremia  Estimated body mass index is 36.02 kg/m as  calculated from the following:   Height as of this encounter: 5\' 7"  (1.702 m).   Weight as of this encounter: 104.3 kg (230 lb). Advance diet Up with therapy Needs BM Klor kon 40 meq  BID, recheck labs in the am DC IV fluids, po fluid restriction, recheck Na in the am Discharge home with HHPT tomorrow   DVT Prophylaxis - Lovenox, Foot Pumps and TED hose Weight-Bearing as tolerated to right leg   T. Rachelle Hora, PA-C [facility] 06/03/2016, 8:07 AM

## 2016-06-04 LAB — BASIC METABOLIC PANEL
ANION GAP: 7 (ref 5–15)
BUN: 11 mg/dL (ref 6–20)
CALCIUM: 8.1 mg/dL — AB (ref 8.9–10.3)
CO2: 28 mmol/L (ref 22–32)
Chloride: 95 mmol/L — ABNORMAL LOW (ref 101–111)
Creatinine, Ser: 0.51 mg/dL (ref 0.44–1.00)
GFR calc Af Amer: >60 mL/min (ref >60)
GFR calc non Af Amer: >60 mL/min (ref >60)
GLUCOSE: 133 mg/dL — AB (ref 65–99)
Potassium: 4.1 mmol/L (ref 3.5–5.1)
Sodium: 130 mmol/L — ABNORMAL LOW (ref 135–145)

## 2016-06-04 MED ORDER — BISACODYL 10 MG RE SUPP
10.0000 mg | Freq: Once | RECTAL | Status: AC
  Administered 2016-06-04: 10 mg via RECTAL
  Filled 2016-06-04: qty 1

## 2016-06-04 MED ORDER — ONDANSETRON HCL 4 MG PO TABS: 4.0000 mg | ORAL_TABLET | Freq: Four times a day (QID) | ORAL | 0 refills | Status: DC | PRN

## 2016-06-04 MED ORDER — OXYCODONE HCL 5 MG PO TABS: 5.0000 mg | ORAL_TABLET | ORAL | 0 refills | Status: DC | PRN

## 2016-06-04 MED ORDER — ENOXAPARIN SODIUM 40 MG/0.4ML ~~LOC~~ SOLN: 40.0000 mg | SUBCUTANEOUS | 0 refills | Status: DC

## 2016-06-04 NOTE — Discharge Instructions (Signed)

## 2016-06-04 NOTE — Progress Notes (Signed)
   Subjective: 3 Days Post-Op Procedure(s) (LRB): TOTAL KNEE ARTHROPLASTY (Right) Patient reports pain as mild.   Patient is well, and has had no acute complaints or problems Denies any CP, SOB, ABD pain. We will continue therapy today.  Plan is to go Home after hospital stay.  Objective: Vital signs in last 24 hours: Temp:  [97.8 F (36.6 C)-98.8 F (37.1 C)] 98.7 F (37.1 C) (08/18 0739) Pulse Rate:  [84-99] 94 (08/18 0739) Resp:  [16-18] 18 (08/18 0739) BP: (122-149)/(51-66) 130/63 (08/18 0739) SpO2:  [94 %-97 %] 94 % (08/18 0739)  Intake/Output from previous day: 08/17 0701 - 08/18 0700 In: 480 [P.O.:480] Out: 0  Intake/Output this shift: No intake/output data recorded.   Recent Labs  06/01/16 1304 06/02/16 0514 06/03/16 0413  HGB 8.7* 10.6* 10.6*    Recent Labs  06/02/16 0514 06/03/16 0413  WBC 12.8* 11.7*  RBC 3.53* 3.41*  HCT 31.8* 30.4*  PLT 277 267    Recent Labs  06/03/16 0413 06/04/16 0525  NA 129* 130*  K 3.3* 4.1  CL 93* 95*  CO2 29 28  BUN 9 11  CREATININE 0.62 0.51  GLUCOSE 141* 133*  CALCIUM 8.0* 8.1*   No results for input(s): LABPT, INR in the last 72 hours.  EXAM General - Patient is Alert, Appropriate and Oriented Extremity - Neurovascular intact Sensation intact distally Intact pulses distally Dorsiflexion/Plantar flexion intact No cellulitis present Compartment soft Dressing - dressing C/D/I and dressing changed.  Motor Function - intact, moving foot and toes well on exam.   Past Medical History:  Diagnosis Date  . Depression   . Hyperlipidemia   . Hypertension   . Obesity, unspecified   . Other specified menopausal and postmenopausal disorder     Assessment/Plan:   3 Days Post-Op Procedure(s) (LRB): TOTAL KNEE ARTHROPLASTY (Right) Active Problems:   Primary localized osteoarthritis of right knee   Acute post op blood loss anemia    Hypokalemia     Hyponatremia  Estimated body mass index is 36.02 kg/m as  calculated from the following:   Height as of this encounter: 5\' 7"  (1.702 m).   Weight as of this encounter: 104.3 kg (230 lb). Advance diet Up with therapy  Discharge home with HHPT today pending BM Follow up with [facility] in 2 weeks   DVT Prophylaxis - Lovenox, Foot Pumps and TED hose Weight-Bearing as tolerated to right leg   T. Rachelle Hora, PA-C [place] 06/04/2016, 8:19 AM

## 2016-06-04 NOTE — Care Management (Signed)
Patient will discharge today. Referrals have been placed for DME and also HH. [facility] agency contacted to inform of discharge. Will sign off.

## 2016-06-04 NOTE — Progress Notes (Addendum)
Patient being discharged to home with home health. Patient's belongings packed and husband here to take her home. IV removed, d/c and rx instructions given and acknowledged understanding. Teds on, immobilizer, walker sent.

## 2016-06-04 NOTE — Discharge Summary (Signed)
Physician Discharge Summary  Patient ID: Kathleen Olsen MRN: [id number] DOB/AGE: [birth year] 55 y.o.  Admit date: 06/01/2016 Discharge date: 06/04/2016  Admission Diagnoses:  primary osteoarthritis   Discharge Diagnoses: Patient Active Problem List   Diagnosis Date Noted  . Primary localized osteoarthritis of right knee 06/01/2016  . Family history of ovarian cancer 06/10/2014  . Special screening for malignant neoplasms, colon 06/10/2014  . Screening for breast cancer 06/10/2014  . Routine general medical examination at a health care facility 06/10/2014  . Hypertension 11/03/2012  . Depression 11/03/2012  . Obesity 11/03/2012    Past Medical History:  Diagnosis Date  . Depression   . Hyperlipidemia   . Hypertension   . Obesity, unspecified   . Other specified menopausal and postmenopausal disorder      Transfusion: none   Consultants (if any):   Discharged Condition: Improved  Hospital Course: JAMIAYA GARRIGAN is an 55 y.o. female who was admitted 06/01/2016 with a diagnosis of right knee osteoarthritis and went to the operating room on 06/01/2016 and underwent the above named procedures.    Surgeries: Procedure(s): TOTAL KNEE ARTHROPLASTY on 06/01/2016 Patient tolerated the surgery well. Taken to PACU where she was stabilized and then transferred to the orthopedic floor.  Started on Lovenox 30 q 12 hrs. Foot pumps applied bilaterally at 80 mm. Heels elevated on bed with rolled towels. No evidence of DVT. Negative [place]. Physical therapy started on day #1 for gait training and transfer. OT started day #1 for ADL and assisted devices.  Patient's foley was d/c on day #1. Patient's IV and hemovac was d/c on day #2.  On post op day #3 patient was stable and ready for discharge to home with HHPT.  Implants: Medacta GMK sphere 4 femur with 3Ti4 tibial baseplate with a short stem 14 mm plastic insert and 2 patella, all components cemented  She was given perioperative  antibiotics:  Anti-infectives    Start     Dose/Rate Route Frequency Ordered Stop   06/01/16 1200  ceFAZolin (ANCEF) IVPB 2g/100 mL premix     2 g 200 mL/hr over 30 Minutes Intravenous Every 6 hours 06/01/16 1029 06/02/16 0159   06/01/16 0558  ceFAZolin (ANCEF) 2-4 GM/100ML-% IVPB    Comments:  Hallaji, Violet Ann: cabinet override      06/01/16 0558 06/01/16 1759   06/01/16 0230  ceFAZolin (ANCEF) IVPB 2g/100 mL premix     2 g 200 mL/hr over 30 Minutes Intravenous  Once 06/01/16 0228 06/01/16 0734    .  She was given sequential compression devices, early ambulation, and loveonx for DVT prophylaxis.  She benefited maximally from the hospital stay and there were no complications.    Recent vital signs:  Vitals:   06/04/16 0329 06/04/16 0739  BP: 124/60 130/63  Pulse: 99 94  Resp: 18 18  Temp: 98.8 F (37.1 C) 98.7 F (37.1 C)    Recent laboratory studies:  Lab Results  Component Value Date   HGB 10.6 (L) 06/03/2016   HGB 10.6 (L) 06/02/2016   HGB 8.7 (L) 06/01/2016   Lab Results  Component Value Date   WBC 11.7 (H) 06/03/2016   PLT 267 06/03/2016   Lab Results  Component Value Date   INR 1.00 05/19/2016   Lab Results  Component Value Date   NA 130 (L) 06/04/2016   K 4.1 06/04/2016   CL 95 (L) 06/04/2016   CO2 28 06/04/2016   BUN 11 06/04/2016   CREATININE 0.51  06/04/2016   GLUCOSE 133 (H) 06/04/2016    Discharge Medications:     Medication List    TAKE these medications   albuterol 108 (90 Base) MCG/ACT inhaler Commonly known as:  PROVENTIL HFA;VENTOLIN HFA Inhale 2 puffs into the lungs every 6 (six) hours as needed for wheezing or shortness of breath.   cetirizine 10 MG tablet Commonly known as:  ZYRTEC Take 10 mg by mouth at bedtime.   enoxaparin 40 MG/0.4ML injection Commonly known as:  LOVENOX Inject 0.4 mLs (40 mg total) into the skin daily.   fluticasone 50 MCG/ACT nasal spray Commonly known as:  FLONASE Place 1 spray into both nostrils  daily as needed for allergies or rhinitis.   ibuprofen 200 MG tablet Commonly known as:  ADVIL,MOTRIN Take 800 mg by mouth 2 (two) times daily as needed for headache or moderate pain.   Insulin Pen Needle 33G X 5 MM Misc 1 application by Does not apply route daily.   Liraglutide -Weight Management 18 MG/3ML Sopn Commonly known as:  SAXENDA Inject 0.6 mg into the skin daily.   ondansetron 4 MG tablet Commonly known as:  ZOFRAN Take 1 tablet (4 mg total) by mouth every 6 (six) hours as needed for nausea.   oxyCODONE 5 MG immediate release tablet Commonly known as:  Oxy IR/ROXICODONE Take 1-2 tablets (5-10 mg total) by mouth every 3 (three) hours as needed for breakthrough pain.   sertraline 50 MG tablet Commonly known as:  ZOLOFT Take 1 tablet (50 mg total) by mouth daily. What changed:  when to take this   valsartan-hydrochlorothiazide 80-12.5 MG tablet Commonly known as:  DIOVAN-HCT Take 1 tablet by mouth daily.       Diagnostic Studies: Dg Knee 1-2 Views Right  Result Date: 06/01/2016 CLINICAL DATA:  Postop, right knee replacement EXAM: RIGHT KNEE - 1-2 VIEW COMPARISON:  04/30/2016 FINDINGS: Two views of the right knee submitted. There is right knee prosthesis with anatomic alignment. Postsurgical changes are noted with small periarticular soft tissue air. Midline skin staples. IMPRESSION: Right knee prosthesis with anatomic alignment. Small periarticular soft tissue air. Electronically Signed   By: Lahoma Crocker M.D.   On: 06/01/2016 09:53    Disposition: 01-Home or [facility] .   Why:  Now known as [facility].  Physical therapy Contact information: [address] [place] [address] [phone]        Inc. - Dme [facility] .   Why:  [employer] information: [place] [address] [place] [place] .   Contact information: [address] [address] [phone]        MENZ,MICHAEL, MD Follow up in 2 week(s).   Specialty:  Orthopedic Surgery Contact information: [place] [address] [phone]            Signed: Feliberto Gottron 06/04/2016, 8:22 AM

## 2016-06-04 NOTE — Progress Notes (Signed)
Physical Therapy Treatment Patient Details Name: ANGENI LIDDELL MRN: [id number] DOB: 17-Dec-1960 Today's Date: 06/04/2016    History of Present Illness Pt underwent R TKR without reported post-op complications. PT evaluation performed on POD#0. No reported falls in the last 12 months.     PT Comments    Pt does very well with PT session showing increased ambulation, strength, mobility and generally is very confident with all acts.  She has some pain but is not limited by it and, ultimately, is safe to go home and should continue to progress nicely.  Pt did well with exercises and was able to negotiate up/down steps.    Follow Up Recommendations  Home health PT     Equipment Recommendations  Rolling walker with 5" wheels    Recommendations for Other Services       Precautions / Restrictions Precautions Precautions: Knee Restrictions RLE Weight Bearing: Weight bearing as tolerated    Mobility  Bed Mobility Overal bed mobility: Modified Independent             General bed mobility comments: Pt did well getting to EOB, did not need any direct assist and only minimal cuing  Transfers Overall transfer level: Independent Equipment used: Rolling walker (2 wheeled) Transfers: Sit to/from Stand Sit to Stand: Supervision         General transfer comment: Pt able to rise to standing and maintain balnace w/o walker use.  She shows good confidence and has no safety issues.  Ambulation/Gait Ambulation/Gait assistance: Supervision Ambulation Distance (Feet): 250 Feet Assistive device: Rolling walker (2 wheeled)       General Gait Details: Pt did very well with ambualtion and though she still has some obvious limping on the R she is able to maintain consistent and safe cadence.   Stairs Stairs: Yes Stairs assistance: Supervision Stair Management: No rails;Forwards;With walker Number of Stairs: 1 General stair comments: Pt needed reminders for sequencing, but overall was  able to negotiate up/down 1 step w/o issue and showing good ability to safely enter/exit the home  Wheelchair Mobility    Modified Rankin (Stroke Patients Only)       Balance Overall balance assessment: Modified Independent                                  Cognition Arousal/Alertness: Awake/alert Behavior During Therapy: WFL for tasks assessed/performed Overall Cognitive Status: Within Functional Limits for tasks assessed                      Exercises Total Joint Exercises Ankle Circles/Pumps: Strengthening;15 reps Quad Sets: Strengthening;15 reps Gluteal Sets: Strengthening;15 reps Short Arc Quad: Strengthening;10 reps Heel Slides: Strengthening;10 reps Hip ABduction/ADduction: Strengthening;15 reps Straight Leg Raises: AROM;10 reps Knee Flexion: PROM;5 reps Goniometric ROM: 0-101    General Comments        Pertinent Vitals/Pain Pain Score: 5  (increased with activity)    Home Living                      Prior Function            PT Goals (current goals can now be found in the care plan section) Progress towards PT goals: Progressing toward goals    Frequency  BID    PT Plan Current plan remains appropriate    Co-evaluation  End of Session Equipment Utilized During Treatment: Gait belt Activity Tolerance: Patient tolerated treatment well Patient left: with chair alarm set;with call bell/phone within reach     Time: 0804-0832 PT Time Calculation (min) (ACUTE ONLY): 28 min  Charges:  $Gait Training: 8-22 mins $Therapeutic Exercise: 8-22 mins                    G Codes:      Kreg Shropshire, DPT 06/04/2016, 10:55 AM

## 2016-06-04 NOTE — Care Management (Signed)
Contacted by Dr Rudene Christians 's office [facility]) concerning patient lovenox. Patient medication was called into pharmacy [facility] yesterday by Hoy Finlay. Preauth signed by Dr Rudene Christians and was faxed Yesterday by same CM.  Contacted [facility] and spoke with pharmacist who stated that drug was being filled and that Co pay was $75. Contacted patients husband Dominica Severin and informed him that meds would be available this afternoon and of co pay amount.Marland Kitchen

## 2016-06-05 DIAGNOSIS — Z96651 Presence of right artificial knee joint: Secondary | ICD-10-CM | POA: Diagnosis not present

## 2016-06-05 DIAGNOSIS — E785 Hyperlipidemia, unspecified: Secondary | ICD-10-CM | POA: Diagnosis not present

## 2016-06-05 DIAGNOSIS — Z7901 Long term (current) use of anticoagulants: Secondary | ICD-10-CM | POA: Diagnosis not present

## 2016-06-05 DIAGNOSIS — F329 Major depressive disorder, single episode, unspecified: Secondary | ICD-10-CM | POA: Diagnosis not present

## 2016-06-05 DIAGNOSIS — Z79891 Long term (current) use of opiate analgesic: Secondary | ICD-10-CM | POA: Diagnosis not present

## 2016-06-05 DIAGNOSIS — E669 Obesity, unspecified: Secondary | ICD-10-CM | POA: Diagnosis not present

## 2016-06-05 DIAGNOSIS — Z6836 Body mass index (BMI) 36.0-36.9, adult: Secondary | ICD-10-CM | POA: Diagnosis not present

## 2016-06-05 DIAGNOSIS — Z471 Aftercare following joint replacement surgery: Secondary | ICD-10-CM | POA: Diagnosis not present

## 2016-06-05 DIAGNOSIS — I10 Essential (primary) hypertension: Secondary | ICD-10-CM | POA: Diagnosis not present

## 2016-06-08 DIAGNOSIS — F329 Major depressive disorder, single episode, unspecified: Secondary | ICD-10-CM | POA: Diagnosis not present

## 2016-06-08 DIAGNOSIS — Z471 Aftercare following joint replacement surgery: Secondary | ICD-10-CM | POA: Diagnosis not present

## 2016-06-08 DIAGNOSIS — E785 Hyperlipidemia, unspecified: Secondary | ICD-10-CM | POA: Diagnosis not present

## 2016-06-08 DIAGNOSIS — Z96651 Presence of right artificial knee joint: Secondary | ICD-10-CM | POA: Diagnosis not present

## 2016-06-08 DIAGNOSIS — Z7901 Long term (current) use of anticoagulants: Secondary | ICD-10-CM | POA: Diagnosis not present

## 2016-06-08 DIAGNOSIS — Z6836 Body mass index (BMI) 36.0-36.9, adult: Secondary | ICD-10-CM | POA: Diagnosis not present

## 2016-06-08 DIAGNOSIS — E669 Obesity, unspecified: Secondary | ICD-10-CM | POA: Diagnosis not present

## 2016-06-08 DIAGNOSIS — I10 Essential (primary) hypertension: Secondary | ICD-10-CM | POA: Diagnosis not present

## 2016-06-08 DIAGNOSIS — Z79891 Long term (current) use of opiate analgesic: Secondary | ICD-10-CM | POA: Diagnosis not present

## 2016-06-09 DIAGNOSIS — F329 Major depressive disorder, single episode, unspecified: Secondary | ICD-10-CM | POA: Diagnosis not present

## 2016-06-09 DIAGNOSIS — Z96651 Presence of right artificial knee joint: Secondary | ICD-10-CM | POA: Diagnosis not present

## 2016-06-09 DIAGNOSIS — E669 Obesity, unspecified: Secondary | ICD-10-CM | POA: Diagnosis not present

## 2016-06-09 DIAGNOSIS — E785 Hyperlipidemia, unspecified: Secondary | ICD-10-CM | POA: Diagnosis not present

## 2016-06-09 DIAGNOSIS — I10 Essential (primary) hypertension: Secondary | ICD-10-CM | POA: Diagnosis not present

## 2016-06-09 DIAGNOSIS — Z6836 Body mass index (BMI) 36.0-36.9, adult: Secondary | ICD-10-CM | POA: Diagnosis not present

## 2016-06-09 DIAGNOSIS — Z471 Aftercare following joint replacement surgery: Secondary | ICD-10-CM | POA: Diagnosis not present

## 2016-06-09 DIAGNOSIS — Z7901 Long term (current) use of anticoagulants: Secondary | ICD-10-CM | POA: Diagnosis not present

## 2016-06-09 DIAGNOSIS — Z79891 Long term (current) use of opiate analgesic: Secondary | ICD-10-CM | POA: Diagnosis not present

## 2016-06-11 DIAGNOSIS — Z79891 Long term (current) use of opiate analgesic: Secondary | ICD-10-CM | POA: Diagnosis not present

## 2016-06-11 DIAGNOSIS — Z6836 Body mass index (BMI) 36.0-36.9, adult: Secondary | ICD-10-CM | POA: Diagnosis not present

## 2016-06-11 DIAGNOSIS — Z96651 Presence of right artificial knee joint: Secondary | ICD-10-CM | POA: Diagnosis not present

## 2016-06-11 DIAGNOSIS — I10 Essential (primary) hypertension: Secondary | ICD-10-CM | POA: Diagnosis not present

## 2016-06-11 DIAGNOSIS — F329 Major depressive disorder, single episode, unspecified: Secondary | ICD-10-CM | POA: Diagnosis not present

## 2016-06-11 DIAGNOSIS — Z7901 Long term (current) use of anticoagulants: Secondary | ICD-10-CM | POA: Diagnosis not present

## 2016-06-11 DIAGNOSIS — E785 Hyperlipidemia, unspecified: Secondary | ICD-10-CM | POA: Diagnosis not present

## 2016-06-11 DIAGNOSIS — Z471 Aftercare following joint replacement surgery: Secondary | ICD-10-CM | POA: Diagnosis not present

## 2016-06-11 DIAGNOSIS — E669 Obesity, unspecified: Secondary | ICD-10-CM | POA: Diagnosis not present

## 2016-06-14 DIAGNOSIS — E669 Obesity, unspecified: Secondary | ICD-10-CM | POA: Diagnosis not present

## 2016-06-14 DIAGNOSIS — Z79891 Long term (current) use of opiate analgesic: Secondary | ICD-10-CM | POA: Diagnosis not present

## 2016-06-14 DIAGNOSIS — E785 Hyperlipidemia, unspecified: Secondary | ICD-10-CM | POA: Diagnosis not present

## 2016-06-14 DIAGNOSIS — I10 Essential (primary) hypertension: Secondary | ICD-10-CM | POA: Diagnosis not present

## 2016-06-14 DIAGNOSIS — Z6836 Body mass index (BMI) 36.0-36.9, adult: Secondary | ICD-10-CM | POA: Diagnosis not present

## 2016-06-14 DIAGNOSIS — Z7901 Long term (current) use of anticoagulants: Secondary | ICD-10-CM | POA: Diagnosis not present

## 2016-06-14 DIAGNOSIS — F329 Major depressive disorder, single episode, unspecified: Secondary | ICD-10-CM | POA: Diagnosis not present

## 2016-06-14 DIAGNOSIS — Z96651 Presence of right artificial knee joint: Secondary | ICD-10-CM | POA: Diagnosis not present

## 2016-06-14 DIAGNOSIS — Z471 Aftercare following joint replacement surgery: Secondary | ICD-10-CM | POA: Diagnosis not present

## 2016-06-15 DIAGNOSIS — Z79891 Long term (current) use of opiate analgesic: Secondary | ICD-10-CM | POA: Diagnosis not present

## 2016-06-15 DIAGNOSIS — Z6836 Body mass index (BMI) 36.0-36.9, adult: Secondary | ICD-10-CM | POA: Diagnosis not present

## 2016-06-15 DIAGNOSIS — Z7901 Long term (current) use of anticoagulants: Secondary | ICD-10-CM | POA: Diagnosis not present

## 2016-06-15 DIAGNOSIS — Z96651 Presence of right artificial knee joint: Secondary | ICD-10-CM | POA: Diagnosis not present

## 2016-06-15 DIAGNOSIS — Z471 Aftercare following joint replacement surgery: Secondary | ICD-10-CM | POA: Diagnosis not present

## 2016-06-15 DIAGNOSIS — E669 Obesity, unspecified: Secondary | ICD-10-CM | POA: Diagnosis not present

## 2016-06-15 DIAGNOSIS — I10 Essential (primary) hypertension: Secondary | ICD-10-CM | POA: Diagnosis not present

## 2016-06-15 DIAGNOSIS — F329 Major depressive disorder, single episode, unspecified: Secondary | ICD-10-CM | POA: Diagnosis not present

## 2016-06-15 DIAGNOSIS — E785 Hyperlipidemia, unspecified: Secondary | ICD-10-CM | POA: Diagnosis not present

## 2016-06-16 DIAGNOSIS — Z96651 Presence of right artificial knee joint: Secondary | ICD-10-CM | POA: Diagnosis not present

## 2016-06-16 DIAGNOSIS — M6281 Muscle weakness (generalized): Secondary | ICD-10-CM | POA: Diagnosis not present

## 2016-06-16 DIAGNOSIS — M25661 Stiffness of right knee, not elsewhere classified: Secondary | ICD-10-CM | POA: Diagnosis not present

## 2016-06-16 DIAGNOSIS — M25561 Pain in right knee: Secondary | ICD-10-CM | POA: Diagnosis not present

## 2016-06-18 DIAGNOSIS — Z96651 Presence of right artificial knee joint: Secondary | ICD-10-CM | POA: Diagnosis not present

## 2016-06-18 DIAGNOSIS — M25561 Pain in right knee: Secondary | ICD-10-CM | POA: Diagnosis not present

## 2016-06-18 DIAGNOSIS — M25661 Stiffness of right knee, not elsewhere classified: Secondary | ICD-10-CM | POA: Diagnosis not present

## 2016-06-18 DIAGNOSIS — M6281 Muscle weakness (generalized): Secondary | ICD-10-CM | POA: Diagnosis not present

## 2016-06-23 DIAGNOSIS — M25661 Stiffness of right knee, not elsewhere classified: Secondary | ICD-10-CM | POA: Diagnosis not present

## 2016-06-23 DIAGNOSIS — Z96651 Presence of right artificial knee joint: Secondary | ICD-10-CM | POA: Diagnosis not present

## 2016-06-23 DIAGNOSIS — M25561 Pain in right knee: Secondary | ICD-10-CM | POA: Diagnosis not present

## 2016-06-23 DIAGNOSIS — M6281 Muscle weakness (generalized): Secondary | ICD-10-CM | POA: Diagnosis not present

## 2016-06-25 DIAGNOSIS — M6281 Muscle weakness (generalized): Secondary | ICD-10-CM | POA: Diagnosis not present

## 2016-06-25 DIAGNOSIS — Z96651 Presence of right artificial knee joint: Secondary | ICD-10-CM | POA: Diagnosis not present

## 2016-06-25 DIAGNOSIS — M25561 Pain in right knee: Secondary | ICD-10-CM | POA: Diagnosis not present

## 2016-06-25 DIAGNOSIS — M25661 Stiffness of right knee, not elsewhere classified: Secondary | ICD-10-CM | POA: Diagnosis not present

## 2016-06-29 DIAGNOSIS — Z96651 Presence of right artificial knee joint: Secondary | ICD-10-CM | POA: Diagnosis not present

## 2016-07-01 DIAGNOSIS — Z96651 Presence of right artificial knee joint: Secondary | ICD-10-CM | POA: Diagnosis not present

## 2016-07-07 DIAGNOSIS — M6281 Muscle weakness (generalized): Secondary | ICD-10-CM | POA: Diagnosis not present

## 2016-07-07 DIAGNOSIS — M25661 Stiffness of right knee, not elsewhere classified: Secondary | ICD-10-CM | POA: Diagnosis not present

## 2016-07-07 DIAGNOSIS — Z96651 Presence of right artificial knee joint: Secondary | ICD-10-CM | POA: Diagnosis not present

## 2016-07-09 DIAGNOSIS — M25661 Stiffness of right knee, not elsewhere classified: Secondary | ICD-10-CM | POA: Diagnosis not present

## 2016-07-09 DIAGNOSIS — M6281 Muscle weakness (generalized): Secondary | ICD-10-CM | POA: Diagnosis not present

## 2016-07-09 DIAGNOSIS — Z96651 Presence of right artificial knee joint: Secondary | ICD-10-CM | POA: Diagnosis not present

## 2016-07-09 DIAGNOSIS — M25561 Pain in right knee: Secondary | ICD-10-CM | POA: Diagnosis not present

## 2016-07-14 DIAGNOSIS — Z96651 Presence of right artificial knee joint: Secondary | ICD-10-CM | POA: Diagnosis not present

## 2016-07-16 ENCOUNTER — Other Ambulatory Visit: Payer: Self-pay

## 2016-07-16 MED ORDER — VALSARTAN-HYDROCHLOROTHIAZIDE 80-12.5 MG PO TABS: 1.0000 | ORAL_TABLET | Freq: Every day | ORAL | 0 refills | Status: DC

## 2016-08-13 ENCOUNTER — Ambulatory Visit (INDEPENDENT_AMBULATORY_CARE_PROVIDER_SITE_OTHER): Payer: BLUE CROSS/BLUE SHIELD | Admitting: Primary Care

## 2016-08-13 ENCOUNTER — Encounter: Payer: Self-pay | Admitting: Primary Care

## 2016-08-13 DIAGNOSIS — I10 Essential (primary) hypertension: Secondary | ICD-10-CM | POA: Diagnosis not present

## 2016-08-13 DIAGNOSIS — E785 Hyperlipidemia, unspecified: Secondary | ICD-10-CM

## 2016-08-13 DIAGNOSIS — M1711 Unilateral primary osteoarthritis, right knee: Secondary | ICD-10-CM

## 2016-08-13 DIAGNOSIS — E559 Vitamin D deficiency, unspecified: Secondary | ICD-10-CM

## 2016-08-13 DIAGNOSIS — R7303 Prediabetes: Secondary | ICD-10-CM

## 2016-08-13 NOTE — Assessment & Plan Note (Signed)
Doing well status post right knee replacement 2 months ago.

## 2016-08-13 NOTE — Assessment & Plan Note (Signed)
Recent labs from occupation above goal for TC, LDL, and Trigs. Encouraged increased activity/exercise and improvements in her diet. She will have this rechecked in 6 months per her occupation.

## 2016-08-13 NOTE — Progress Notes (Signed)
Subjective:    Patient ID: Kathleen Olsen, female    DOB: 1961/08/31, 55 y.o.   MRN: [id number]  HPI  Ms. Kathleen Olsen is a 55 year old female who presents today transfer care from Dr. Gilford Olsen. Her last complete physical was over 1 year ago.  1) Essential Hypertension: Diagnosed 11 years ago. Currently managed on Diovan-HCT 80/12.5 mg. She denies chest pain, dizziness, visual changes. She does not check her BP at home.  BP Readings from Last 3 Encounters:  08/13/16 116/78  06/04/16 130/63  05/19/16 (!) 143/89     2) Anxiety and Depression: Currently managed on Zoloft 50 mg tablets. She is doing well on Zoloft. Denies SI/HI. Also takes for vasomotor symptoms of menopause.  3) Hyperlipidemia: Presents today with labs from occupation with TC of 244, LDL of 161, and Triglycerides of 180. She has no prior history of hyperlipidemia. She endorses a fair diet as she doesn't eat fried foods, fast foods, and consumes a limited amount of alcohol. She is status post knee replacement to her right knee 2 months ago. She plans on becoming more active now as she's recovered. She is scheduled to have her lipids rechecked in 6 months through her occupation.   4) Prediabetes: Presents today with labs from occupation with A1C of 5.9. She has no prior history of type 2 diabetes, but did have an A1C of 6.0 in December 2016. She does have a strong family history of type 2 diabetes. Denies numbness/tingling, weakness, polyuria.  5) Vitamin D Deficiency: Recent level of 20 from labs on recent occupation. She does not currently take supplemental Vitamin D. She is status post total knee replacement 2 months ago.  Review of Systems  Constitutional: Negative for fatigue.  Respiratory: Negative for shortness of breath.   Cardiovascular: Negative for chest pain.  Endocrine: Negative for polyuria.  Musculoskeletal: Negative for arthralgias.  Skin: Negative for color change.  Neurological: Negative for dizziness, numbness  and headaches.  Psychiatric/Behavioral: Negative for sleep disturbance and suicidal ideas. The patient is not nervous/anxious.        Past Medical History:  Diagnosis Date  . Depression   . Hyperlipidemia   . Hypertension   . Obesity, unspecified   . Other specified menopausal and postmenopausal disorder      Social History   Social History  . Marital status: Married    Spouse name: N/A  . Number of children: N/A  . Years of education: N/A   Occupational History  . Not on file.   Social History Main Topics  . Smoking status: Never Smoker  . Smokeless tobacco: Never Used  . Alcohol use Yes     Comment: occasional wine or beer  . Drug use: No  . Sexual activity: Not on file   Other Topics Concern  . Not on file   Social History Narrative   Lives in [place].    Work - [employer]   Diet - regular diet, starting a weight loss challenge   Enjoys visiting her beach home, riding bikes, sewing.     Past Surgical History:  Procedure Laterality Date  . CESAREAN SECTION  02-22-80  . CHOLECYSTECTOMY    . COLONOSCOPY WITH PROPOFOL N/A 01/09/2016   Procedure: COLONOSCOPY WITH PROPOFOL;  Surgeon: Lollie Sails, MD;  Location: [facility] ENDOSCOPY;  Service: Endoscopy;  Laterality: N/A;  . TOTAL KNEE ARTHROPLASTY Right 06/01/2016   Procedure: TOTAL KNEE ARTHROPLASTY;  Surgeon: Hessie Knows, MD;  Location: [facility] ORS;  Service:  Orthopedics;  Laterality: Right;    Family History  Problem Relation Age of Onset  . Diabetes Mother   . Heart disease Father   . Hypertension Father   . Cancer Sister     breast  . Cancer Paternal Grandmother     ovarian    No Known Allergies  Current Outpatient Prescriptions on File Prior to Visit  Medication Sig Dispense Refill  . albuterol (PROVENTIL HFA;VENTOLIN HFA) 108 (90 Base) MCG/ACT inhaler Inhale 2 puffs into the lungs every 6 (six) hours as needed for wheezing or shortness of breath.    . cetirizine (ZYRTEC) 10 MG tablet Take 10 mg by  mouth at bedtime.  4  . fluticasone (FLONASE) 50 MCG/ACT nasal spray Place 1 spray into both nostrils daily as needed for allergies or rhinitis.    Marland Kitchen ibuprofen (ADVIL,MOTRIN) 200 MG tablet Take 800 mg by mouth 2 (two) times daily as needed for headache or moderate pain.    Marland Kitchen sertraline (ZOLOFT) 50 MG tablet Take 1 tablet (50 mg total) by mouth daily. 30 tablet 11  . valsartan-hydrochlorothiazide (DIOVAN-HCT) 80-12.5 MG tablet Take 1 tablet by mouth daily. 90 tablet 0   No current facility-administered medications on file prior to visit.     BP 116/78   Pulse 87   Temp 98.1 F (36.7 C) (Oral)   Ht 5' 6.5" (1.689 m)   Wt 240 lb 1.9 oz (108.9 kg)   SpO2 97%   BMI 38.18 kg/m    Objective:   Physical Exam  Constitutional: She appears well-nourished.  Neck: Neck supple.  Cardiovascular: Normal rate and regular rhythm.   Pulmonary/Chest: Effort normal and breath sounds normal.  Skin: Skin is warm and dry.  Surgical site to right knee healed well.  Psychiatric: She has a normal mood and affect.          Assessment & Plan:

## 2016-08-13 NOTE — Progress Notes (Signed)
Pre visit review using our clinic review tool, if applicable. No additional management support is needed unless otherwise documented below in the visit note. 

## 2016-08-13 NOTE — Assessment & Plan Note (Signed)
Level of 20 on recent labs. Encouraged OTC 1000 units once daily. Will continue to monitor.

## 2016-08-13 NOTE — Assessment & Plan Note (Signed)
A1C of 5.9 on recent labs, history of in December 2016. Discussed to increase activity levels and improve diet. Will continue to monitor.

## 2016-08-13 NOTE — Assessment & Plan Note (Signed)
Stable on Zoloft, continue same.

## 2016-08-13 NOTE — Assessment & Plan Note (Signed)
Stable in the office today. Continue Diovan.

## 2016-08-13 NOTE — Patient Instructions (Signed)
Your vitamin D level is too low. I recommend starting Vitamin D 1000 units once daily. This may be purchased over the counter.  Your cholesterol is slightly too high. Continue to work on a healthy diet and start exercising as discussed. You should be getting 150 minutes of moderate intensity exercise weekly, but work up to this slowly.  You are due for your annual physical in January 2018. We will also complete your Pap.  It was a pleasure to meet you today! Please don't hesitate to call me with any questions. Welcome to [facility]!

## 2016-10-20 ENCOUNTER — Other Ambulatory Visit: Payer: Self-pay | Admitting: Family Medicine

## 2016-11-03 ENCOUNTER — Encounter: Payer: Self-pay | Admitting: Primary Care

## 2016-11-04 ENCOUNTER — Encounter: Payer: BLUE CROSS/BLUE SHIELD | Admitting: Primary Care

## 2016-11-11 ENCOUNTER — Other Ambulatory Visit (HOSPITAL_COMMUNITY)
Admission: RE | Admit: 2016-11-11 | Discharge: 2016-11-11 | Disposition: A | Discharge status: Home / Self Care | Payer: BLUE CROSS/BLUE SHIELD | Source: Ambulatory Visit | Attending: Primary Care | Admitting: Primary Care

## 2016-11-11 ENCOUNTER — Encounter: Payer: Self-pay | Admitting: Primary Care

## 2016-11-11 ENCOUNTER — Ambulatory Visit (INDEPENDENT_AMBULATORY_CARE_PROVIDER_SITE_OTHER): Payer: BLUE CROSS/BLUE SHIELD | Admitting: Primary Care

## 2016-11-11 VITALS — BP 118/80 | HR 85 | Temp 97.7°F | Ht 66.5 in | Wt 242.8 lb

## 2016-11-11 DIAGNOSIS — R7303 Prediabetes: Secondary | ICD-10-CM

## 2016-11-11 DIAGNOSIS — Z0000 Encounter for general adult medical examination without abnormal findings: Secondary | ICD-10-CM | POA: Diagnosis not present

## 2016-11-11 DIAGNOSIS — Z01419 Encounter for gynecological examination (general) (routine) without abnormal findings: Secondary | ICD-10-CM | POA: Diagnosis not present

## 2016-11-11 DIAGNOSIS — M1711 Unilateral primary osteoarthritis, right knee: Secondary | ICD-10-CM | POA: Diagnosis not present

## 2016-11-11 DIAGNOSIS — Z124 Encounter for screening for malignant neoplasm of cervix: Secondary | ICD-10-CM | POA: Diagnosis not present

## 2016-11-11 DIAGNOSIS — E559 Vitamin D deficiency, unspecified: Secondary | ICD-10-CM

## 2016-11-11 DIAGNOSIS — Z1151 Encounter for screening for human papillomavirus (HPV): Secondary | ICD-10-CM | POA: Insufficient documentation

## 2016-11-11 DIAGNOSIS — I10 Essential (primary) hypertension: Secondary | ICD-10-CM | POA: Diagnosis not present

## 2016-11-11 DIAGNOSIS — E785 Hyperlipidemia, unspecified: Secondary | ICD-10-CM

## 2016-11-11 DIAGNOSIS — F3342 Major depressive disorder, recurrent, in full remission: Secondary | ICD-10-CM

## 2016-11-11 MED ORDER — SERTRALINE HCL 50 MG PO TABS: ORAL_TABLET | ORAL | 0 refills | Status: DC

## 2016-11-11 NOTE — Assessment & Plan Note (Signed)
Low today, recommended Vitamin D 1000 unit capsules daily.

## 2016-11-11 NOTE — Assessment & Plan Note (Signed)
Slight increase in A1C to 6.1. Discussed the importance of a healthy diet and regular exercise in order for weight loss, and to reduce blood sugars. Recheck A1C later this year.

## 2016-11-11 NOTE — Assessment & Plan Note (Signed)
Stable. Continue Diovan for renal protection.

## 2016-11-11 NOTE — Assessment & Plan Note (Signed)
Doing well off Zoloft x 3 week, mood stable off Zoloft. More energetic, but return of hot flashes which are uncomfortable. Will have her restart Zoloft at 25 mg, if this improves hot flashes, will continue.

## 2016-11-11 NOTE — Assessment & Plan Note (Signed)
Doing well post op, discussed to start exercising.

## 2016-11-11 NOTE — Progress Notes (Signed)
Pre visit review using our clinic review tool, if applicable. No additional management support is needed unless otherwise documented below in the visit note. 

## 2016-11-11 NOTE — Assessment & Plan Note (Signed)
Improvement in TC, Trigs, and LDL. Encouraged her to start exercising as this is key to reducing weight and cholesterol.

## 2016-11-11 NOTE — Patient Instructions (Addendum)
Complete your mammogram this year.  We will notify you of your pap results once received.  It's important to improve your diet by reducing consumption of processed snack foods, diet drinks, restaurant food. Increase consumption of fresh vegetables and fruits, whole grains, water.  Ensure you are drinking 64 ounces of water daily.  Start exercising. You should be getting 150 minutes of moderate intensity exercise weekly.  We need to repeat your A1C later this year, please schedule this through [facility].  Follow up in 1 year for annual physical or sooner if needed.  It was a pleasure to see you today!

## 2016-11-11 NOTE — Assessment & Plan Note (Signed)
Immunizations UTD. Pap due, completed today. Mammogram UTD. Colonoscopy UTD. Discussed the importance of a healthy diet and regular exercise in order for weight loss, and to reduce the risk of other medical diseases. Exam unremarkable. Labs improved. Continue to follow A1C. Follow up in 1 year for annual physical.

## 2016-11-11 NOTE — Progress Notes (Signed)
Subjective:    Patient ID: Kathleen Olsen, female    DOB: 22-May-1961, 56 y.o.   MRN: [id number]  HPI  [name] is a 56 year old female who presents today for complete physical.  Immunizations: -Tetanus: Completed within 10 years -Influenza: Completed in October 2017 -Shingles: Completed in 2013  Diet: She endorses a fair diet Breakfast: Sausage, eggs Lunch: Salad, grits,  Dinner: Some restaurants, chicken, hamburger steak, veggies Snacks: Yogurt, fruit, nuts, chips, crackers Desserts: Occasionally Beverages: Sweet/un-sweet tea, diet soft drinks, water, occasional wine  Exercise: She does not routinely exercise Eye exam: Completed 1 year ago Dental exam: Completes semi-annually Colonoscopy: Completed in 2017, due in 5 years Pap Smear: Due Mammogram: Completed in May 2017 Hep C Screen: Next visit   Review of Systems  Constitutional: Negative for unexpected weight change.  HENT: Negative for rhinorrhea.   Respiratory: Negative for cough and shortness of breath.   Cardiovascular: Negative for chest pain.  Gastrointestinal: Negative for constipation and diarrhea.  Genitourinary: Negative for difficulty urinating and menstrual problem.  Musculoskeletal: Negative for arthralgias and myalgias.  Skin: Negative for rash.  Allergic/Immunologic: Negative for environmental allergies.  Neurological: Negative for dizziness, numbness and headaches.  Psychiatric/Behavioral:       Off of Zoloft for 3 weeks, feeling more energetic except for return of hot flashes       Past Medical History:  Diagnosis Date  . Depression   . Hyperlipidemia   . Hypertension   . Obesity, unspecified   . Other specified menopausal and postmenopausal disorder      Social History   Social History  . Marital status: Married    Spouse name: N/A  . Number of children: N/A  . Years of education: N/A   Occupational History  . Not on file.   Social History Main Topics  . Smoking status: Never  Smoker  . Smokeless tobacco: Never Used  . Alcohol use Yes     Comment: occasional wine or beer  . Drug use: No  . Sexual activity: Not on file   Other Topics Concern  . Not on file   Social History Narrative   Lives in [place].    Work - [employer]   Diet - regular diet, starting a weight loss challenge   Enjoys visiting her beach home, riding bikes, sewing.     Past Surgical History:  Procedure Laterality Date  . CESAREAN SECTION  02-22-80  . CHOLECYSTECTOMY    . COLONOSCOPY WITH PROPOFOL N/A 01/09/2016   Procedure: COLONOSCOPY WITH PROPOFOL;  Surgeon: Lollie Sails, MD;  Location: [facility] ENDOSCOPY;  Service: Endoscopy;  Laterality: N/A;  . 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 Diabetes Mother   . Heart disease Father   . Hypertension Father   . Cancer Sister     breast  . Cancer Paternal Grandmother     ovarian    No Known Allergies  Current Outpatient Prescriptions on File Prior to Visit  Medication Sig Dispense Refill  . albuterol (PROVENTIL HFA;VENTOLIN HFA) 108 (90 Base) MCG/ACT inhaler Inhale 2 puffs into the lungs every 6 (six) hours as needed for wheezing or shortness of breath.    . cetirizine (ZYRTEC) 10 MG tablet Take 10 mg by mouth at bedtime.  4  . fluticasone (FLONASE) 50  MCG/ACT nasal spray Place 1 spray into both nostrils daily as needed for allergies or rhinitis.    Marland Kitchen ibuprofen (ADVIL,MOTRIN) 200 MG tablet Take 800 mg by mouth 2 (two) times daily as needed for headache or moderate pain.    Marland Kitchen sertraline (ZOLOFT) 50 MG tablet Take 1 tablet (50 mg total) by mouth daily. 30 tablet 11  . valsartan-hydrochlorothiazide (DIOVAN-HCT) 80-12.5 MG tablet Take 1 tablet by mouth daily. 90 tablet 0   No current facility-administered medications on file prior to visit.     BP  118/80   Pulse 85   Temp 97.7 F (36.5 C) (Oral)   Ht 5' 6.5" (1.689 m)   Wt 242 lb 12.8 oz (110.1 kg)   SpO2 98%   BMI 38.60 kg/m    Objective:   Physical Exam  Constitutional: She is oriented to person, place, and time. She appears well-nourished.  HENT:  Right Ear: Tympanic membrane and ear canal normal.  Left Ear: Tympanic membrane and ear canal normal.  Nose: Nose normal.  Mouth/Throat: Oropharynx is clear and moist.  Eyes: Conjunctivae and EOM are normal. Pupils are equal, round, and reactive to light.  Neck: Neck supple. No thyromegaly present.  Cardiovascular: Normal rate and regular rhythm.   No murmur heard. Pulmonary/Chest: Effort normal and breath sounds normal. She has no rales.  Abdominal: Soft. Bowel sounds are normal. There is no tenderness.  Genitourinary: There is no lesion on the right labia. There is no lesion on the left labia. Cervix exhibits no motion tenderness and no discharge. Right adnexum displays no tenderness. Left adnexum displays no tenderness. No vaginal discharge found.  Musculoskeletal: Normal range of motion.  Lymphadenopathy:    She has no cervical adenopathy.  Neurological: She is alert and oriented to person, place, and time. She has normal reflexes. No cranial nerve deficit.  Skin: Skin is warm and dry. No rash noted.  Psychiatric: She has a normal mood and affect.          Assessment & Plan:

## 2016-11-15 LAB — CYTOLOGY - PAP
Diagnosis: NEGATIVE
HPV (WINDOPATH): NOT DETECTED

## 2016-12-09 ENCOUNTER — Other Ambulatory Visit: Payer: Self-pay | Admitting: Primary Care

## 2016-12-09 DIAGNOSIS — F3342 Major depressive disorder, recurrent, in full remission: Secondary | ICD-10-CM

## 2016-12-09 MED ORDER — SERTRALINE HCL 25 MG PO TABS: 25.0000 mg | ORAL_TABLET | Freq: Every day | ORAL | 2 refills | Status: DC

## 2016-12-09 NOTE — Telephone Encounter (Signed)
Noted. Sertraline 25 mg sent to pharmacy.

## 2016-12-09 NOTE — Telephone Encounter (Signed)
Is she taking 25 mg? Are her hot flashes better on this lower dose? Energy levels better? Does she want a refill? Which dose? 25 mg or 50?

## 2016-12-09 NOTE — Telephone Encounter (Signed)
Ok to refill? Electronically refill request for 90 days supply sertraline (ZOLOFT) 50 MG tablet. Last prescribed and seen on 11/11/2016.

## 2016-12-09 NOTE — Telephone Encounter (Signed)
Spoken to patient and stated that the reason for the 1/2 of 50 mg tablet because taking the whole tablet made her feel too fatigue but it did help the hot flashes. Patient stated that she willing to take the 25 mg tablet instead since that is what she has been taking. Patient stated 90 days supply, please.

## 2017-01-05 ENCOUNTER — Other Ambulatory Visit: Payer: Self-pay | Admitting: Family Medicine

## 2017-01-06 ENCOUNTER — Other Ambulatory Visit: Payer: Self-pay | Admitting: Family Medicine

## 2017-01-06 DIAGNOSIS — I10 Essential (primary) hypertension: Secondary | ICD-10-CM

## 2017-01-06 MED ORDER — VALSARTAN-HYDROCHLOROTHIAZIDE 80-12.5 MG PO TABS: 1.0000 | ORAL_TABLET | Freq: Every day | ORAL | 2 refills | Status: DC

## 2017-02-17 DIAGNOSIS — Z1231 Encounter for screening mammogram for malignant neoplasm of breast: Secondary | ICD-10-CM | POA: Diagnosis not present

## 2017-07-18 IMAGING — MR MR KNEE*R* W/O CM
6 series · 36 of 40 positions shown · non-contrast
Comparison: None.

CLINICAL DATA: Right knee pain for 8 months, worsened over the past
2 months. No known injury. Initial encounter.

EXAM:
MRI OF THE RIGHT KNEE WITHOUT CONTRAST
TECHNIQUE: Multiplanar, multisequence MR imaging of the knee was performed. No
intravenous contrast was administered.

[Series 6: PD fat-sat · axial · 3.0mm · 0.29mm/px · z∈[-81,+37]mm · 8 of 37 slices shown (1 of 3)]
[im 1/37]
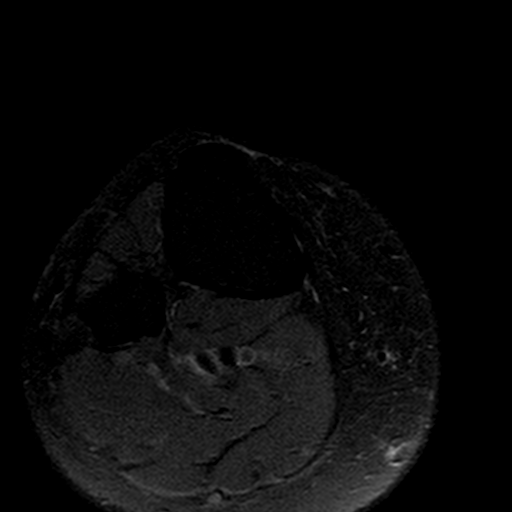
[im 6/37]
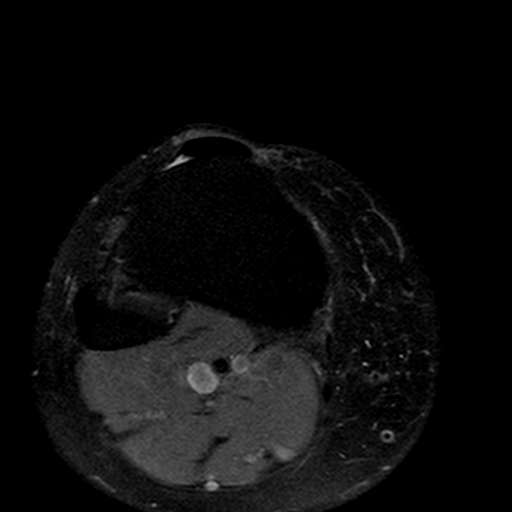
[im 11/37]
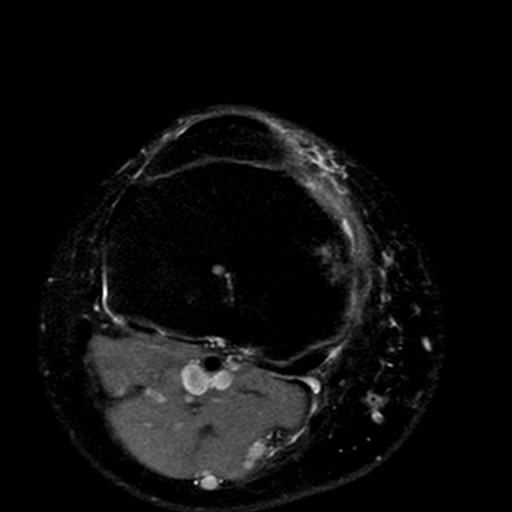
[im 16/37]
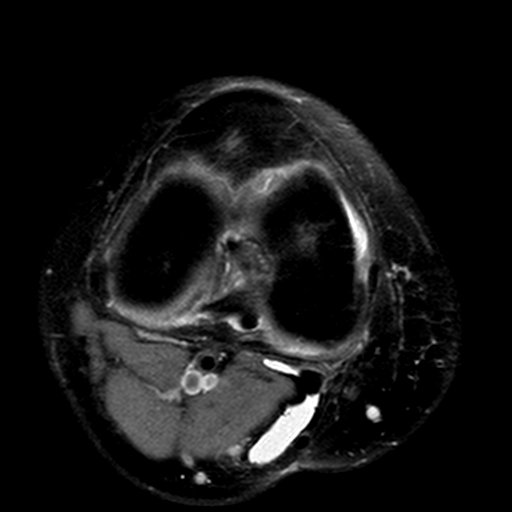
[im 21/37]
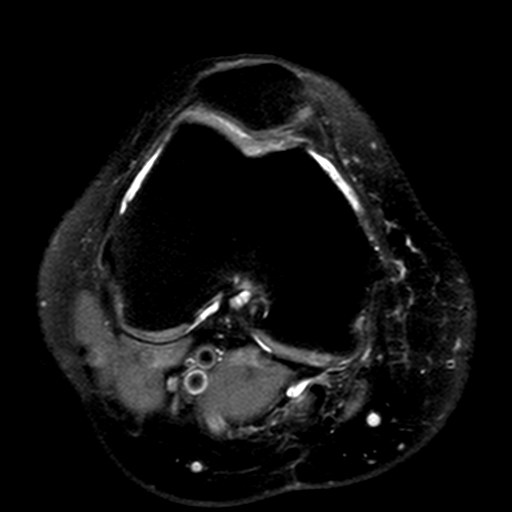
[im 26/37]
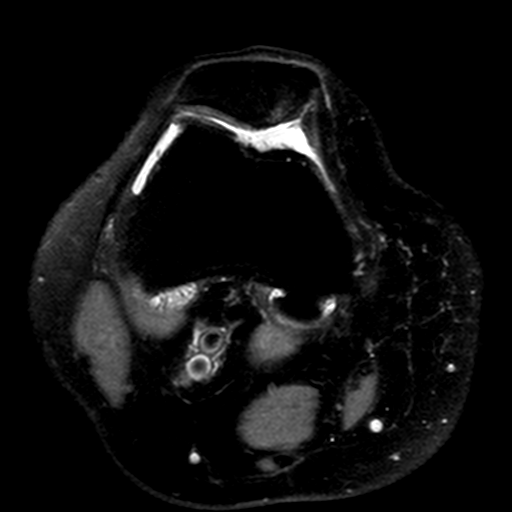
[im 31/37]
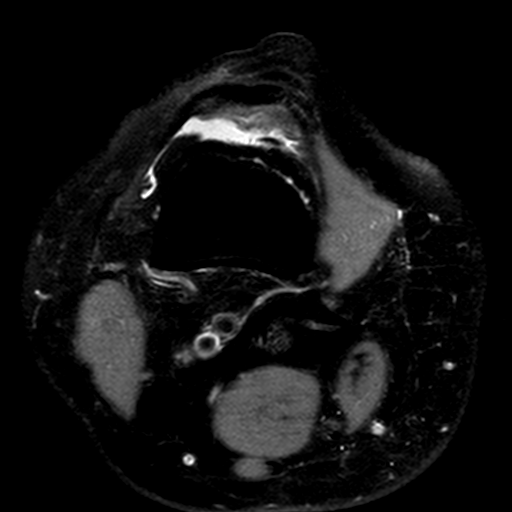
[im 37/37]
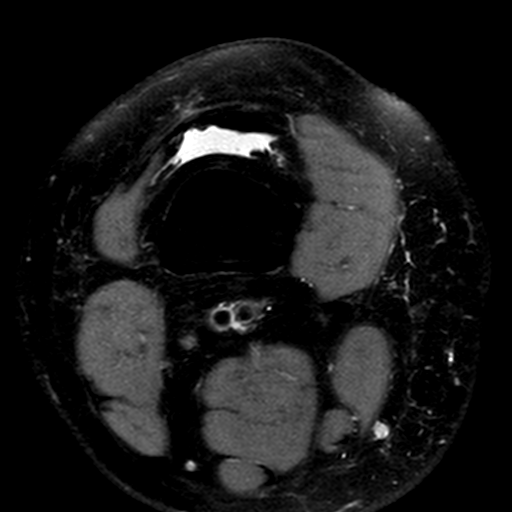

[Series 7: T2 fat-sat · coronal · 3.0mm · 0.62mm/px · 7 of 32 slices shown]
[im 1/32]
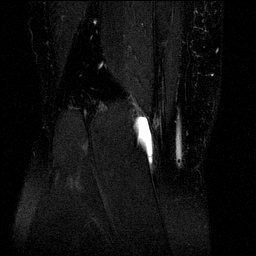
[im 6/32]
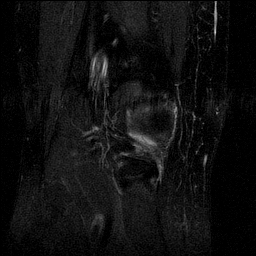
[im 11/32]
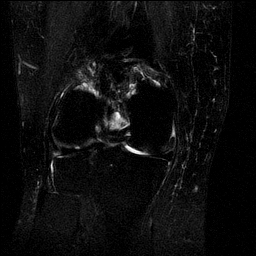
[im 16/32]
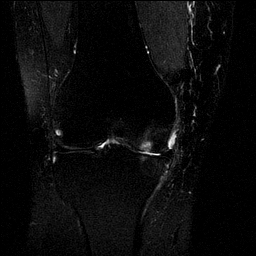
[im 21/32]
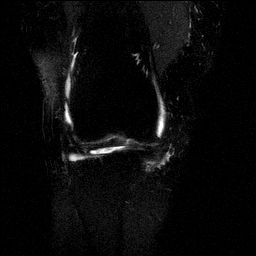
[im 26/32]
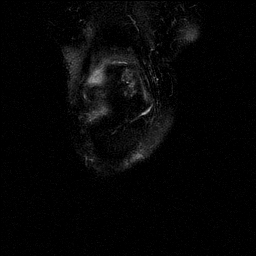
[im 32/32]
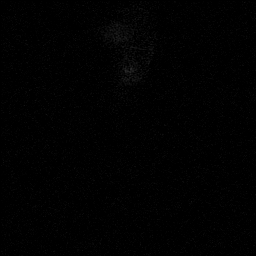

[Series 8: PD fat-sat · coronal · 3.0mm · 0.62mm/px · 7 of 33 slices shown (2 of 3)]
[im 1/33]
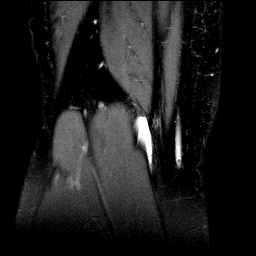
[im 6/33]
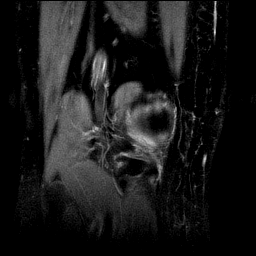
[im 11/33]
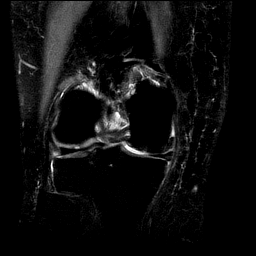
[im 17/33]
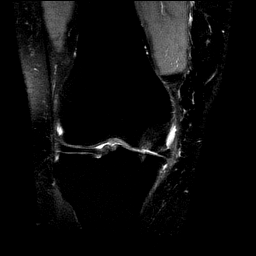
[im 22/33]
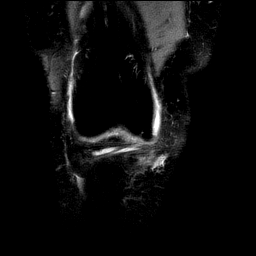
[im 27/33]
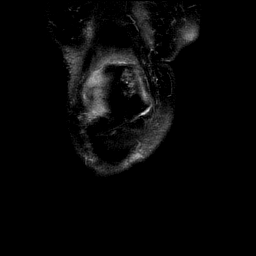
[im 33/33]
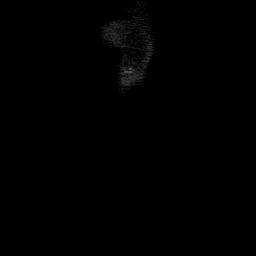

[Series 9: PD fat-sat · sagittal · 3.0mm · 0.62mm/px · 7 of 35 slices shown (3 of 3)]
[im 1/35]
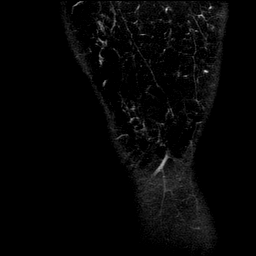
[im 6/35]
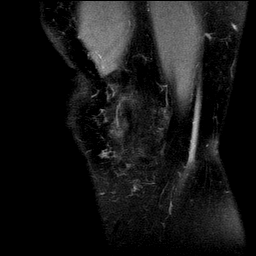
[im 12/35]
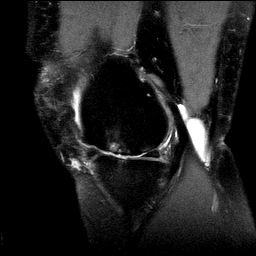
[im 18/35]
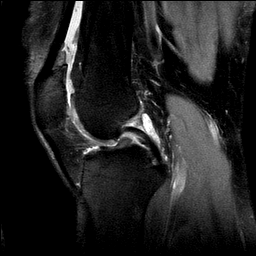
[im 23/35]
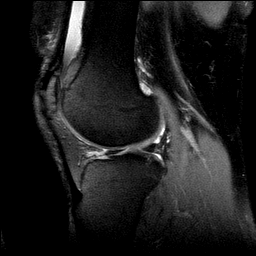
[im 29/35]
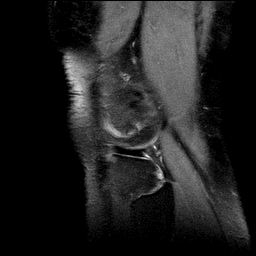
[im 35/35]
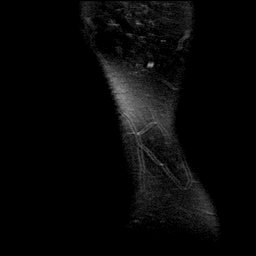

[Series 10: T1 · coronal · 3.0mm · 0.62mm/px · 3 of 33 slices shown]
[im 1/33]
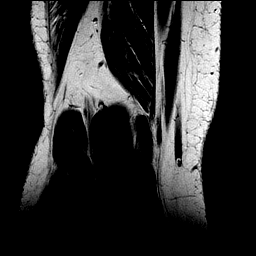
[im 6/33]
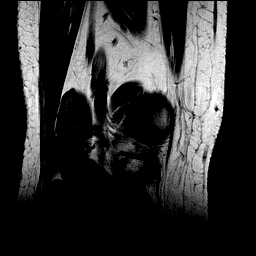
[im 11/33]
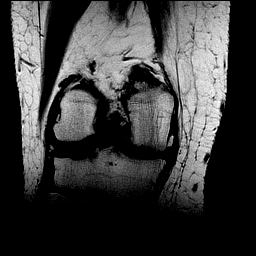

[Series 11: PD · coronal · 3.0mm · 0.31mm/px · 4 of 17 slices shown]
[im 1/17]
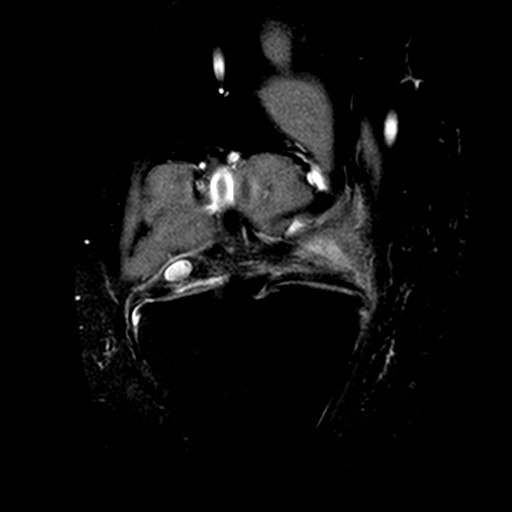
[im 6/17]
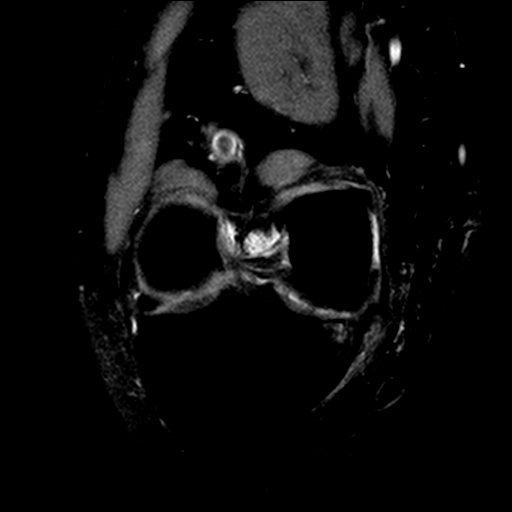
[im 11/17]
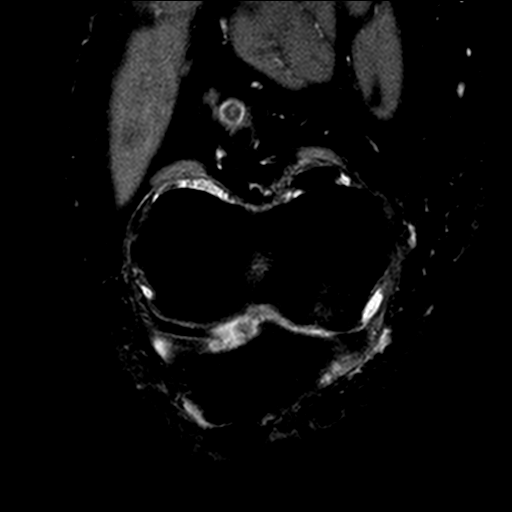
[im 17/17]
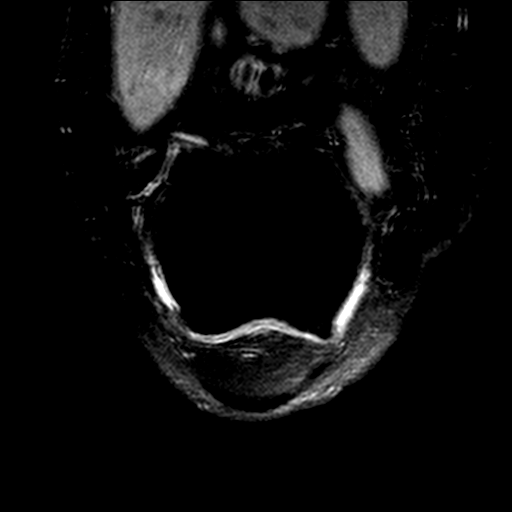

[36 of 40 positions shown; findings below may reference images not displayed]

FINDINGS: MENISCI

Medial meniscus: Degenerative signal is seen in the posterior horn
and body but no tear is identified.

Lateral meniscus:  Intact.

LIGAMENTS

Cruciates:  Intact.

Collaterals:  Intact.

CARTILAGE

Patellofemoral: Extensive hyaline cartilage loss is present at the
apex of the patella and along the medial facet with associated
subchondral edema.

Medial: Markedly thinned with associated joint space narrowing,
subchondral cyst formation and mild edema.

Lateral:  Moderately degenerated.

Joint:  Small joint effusion.

Popliteal Fossa: Baker's cyst measures 2.0 cm AP by 0.8 cm
transverse by 4.2 cm craniocaudal.

Extensor Mechanism:  Intact.

Bones: Tricompartmental osteophytosis is present. No fracture or
worrisome marrow lesion.
IMPRESSION: Dominant finding is advanced for age osteoarthritis appearing worst
in the medial and patellofemoral compartments.

Negative for meniscal or ligament tear.

Small Baker cyst.

## 2017-09-16 ENCOUNTER — Other Ambulatory Visit: Payer: Self-pay | Admitting: Primary Care

## 2017-09-16 DIAGNOSIS — F3342 Major depressive disorder, recurrent, in full remission: Secondary | ICD-10-CM

## 2017-09-25 ENCOUNTER — Other Ambulatory Visit: Payer: Self-pay | Admitting: Primary Care

## 2017-09-25 DIAGNOSIS — I10 Essential (primary) hypertension: Secondary | ICD-10-CM

## 2017-12-15 ENCOUNTER — Other Ambulatory Visit: Payer: Self-pay | Admitting: Primary Care

## 2017-12-15 ENCOUNTER — Telehealth: Payer: Self-pay | Admitting: Primary Care

## 2017-12-15 DIAGNOSIS — F3342 Major depressive disorder, recurrent, in full remission: Secondary | ICD-10-CM

## 2017-12-15 NOTE — Telephone Encounter (Signed)
Pt will need to have appt scheduled in order to get refills on requested medication. Last OV 11/11/16.

## 2017-12-15 NOTE — Telephone Encounter (Signed)
Pt said that everytime she tries to put in a refill request on mychart she gets a message back saying that she is not a patient at [facility]. Patient has not had an OV since 11/11/16. She wants to know why this keeps happening bc she is a patient of [facility]. She needs her Zoloft refilled at [facility] in [place] on [address].

## 2017-12-16 NOTE — Telephone Encounter (Signed)
Patient called, left VM to call back to schedule office visit for a physical in order to get Zoloft refilled.

## 2017-12-27 ENCOUNTER — Other Ambulatory Visit: Payer: Self-pay | Admitting: Primary Care

## 2017-12-27 DIAGNOSIS — I10 Essential (primary) hypertension: Secondary | ICD-10-CM

## 2018-01-12 ENCOUNTER — Encounter: Payer: Self-pay | Admitting: Primary Care

## 2018-01-12 ENCOUNTER — Ambulatory Visit: Payer: BLUE CROSS/BLUE SHIELD | Admitting: Primary Care

## 2018-01-12 VITALS — BP 122/76 | HR 84 | Temp 98.2°F | Ht 66.5 in | Wt 237.5 lb

## 2018-01-12 DIAGNOSIS — R7303 Prediabetes: Secondary | ICD-10-CM | POA: Diagnosis not present

## 2018-01-12 DIAGNOSIS — J302 Other seasonal allergic rhinitis: Secondary | ICD-10-CM | POA: Diagnosis not present

## 2018-01-12 DIAGNOSIS — E785 Hyperlipidemia, unspecified: Secondary | ICD-10-CM | POA: Diagnosis not present

## 2018-01-12 DIAGNOSIS — F3342 Major depressive disorder, recurrent, in full remission: Secondary | ICD-10-CM

## 2018-01-12 DIAGNOSIS — I10 Essential (primary) hypertension: Secondary | ICD-10-CM

## 2018-01-12 MED ORDER — VALSARTAN-HYDROCHLOROTHIAZIDE 80-12.5 MG PO TABS: 1.0000 | ORAL_TABLET | Freq: Every day | ORAL | 3 refills | Status: DC

## 2018-01-12 MED ORDER — ALBUTEROL SULFATE HFA 108 (90 BASE) MCG/ACT IN AERS: 2.0000 | INHALATION_SPRAY | Freq: Four times a day (QID) | RESPIRATORY_TRACT | 0 refills | Status: DC | PRN

## 2018-01-12 MED ORDER — SERTRALINE HCL 25 MG PO TABS: 25.0000 mg | ORAL_TABLET | Freq: Every day | ORAL | 3 refills | Status: DC

## 2018-01-12 NOTE — Patient Instructions (Signed)
Continue to work on [employer].  Start exercising. You should be getting 150 minutes of moderate intensity exercise weekly.  Use the albuterol inhaler as needed.  Please fax me a copy of your labs from last year.  It was a pleasure to see you today!

## 2018-01-12 NOTE — Assessment & Plan Note (Signed)
Stable in the office today. She will fax lab results from October 2018. Continue Diovan.

## 2018-01-12 NOTE — Assessment & Plan Note (Signed)
Discussed the importance of a healthy diet and regular exercise in order for weight loss, and to reduce the risk of any potential medical problems.  

## 2018-01-12 NOTE — Assessment & Plan Note (Signed)
Managed on Flonase, Zyrtec, and albuterol PRN. Continue same. Refill for albuterol sent to pharmacy.

## 2018-01-12 NOTE — Assessment & Plan Note (Signed)
Doing well on Zoloft, primarily taking for vasomotor symptoms with improvement. Denies SI/HI. Continue same.

## 2018-01-12 NOTE — Assessment & Plan Note (Signed)
Endorses improvement in October 2018, she will fax lab results.

## 2018-01-12 NOTE — Progress Notes (Signed)
Subjective:    Patient ID: Carley Hammed, female    DOB: 08-12-1961, 57 y.o.   MRN: [id number]  HPI  Ms. Callanan is a 57 year old female who presents today for follow up. She had labs drawn at work in October 2018, she will bring a copy of labs for our records.  1) Essential Hypertension: Currently managed on valsartan-HCTZ 80/12.5 mg. She denies chest pain, dizziness, shortness of breath.   BP Readings from Last 3 Encounters:  01/12/18 122/76  11/11/16 118/80  08/13/16 116/78     2) Depression/Vasomotor Hot Flashes: Currently managed on Zoloft 25 mg primarily for hot flahes. She denies SI/HI. Overall she's doing well with a reduction in hot flashes.  3) Hyperlipidemia: Lipid panel from January 2018 with TC of 219, LDL of 144, HDL of 52. She had repeat labs drawn in October 2018 and endorses improvement.   4) Prediabetes: A1C from January 2018 of 6.1. She had labs drawn in October 2018 and endorses improvement. She's made improvements in her diet by eating smaller meals, eating less fried/fatty food, and is eating at home more often.  5) Allergic Rhinitis/Seasonal Allergies: Currently managed on Zyrtec and albuterol during seasonal changes. No recent use of albuterol but is needing a refill today. She denies cough, wheezing, shortness of breath.   Review of Systems  Constitutional: Negative for fatigue.  Eyes: Negative for visual disturbance.  Respiratory: Negative for shortness of breath.   Cardiovascular: Negative for chest pain.  Genitourinary:       Improvement in hot flahes  Neurological: Negative for dizziness and headaches.  Psychiatric/Behavioral: Negative for suicidal ideas.       Past Medical History:  Diagnosis Date  . Depression   . Hyperlipidemia   . Hypertension   . Obesity, unspecified   . Other specified menopausal and postmenopausal disorder      Social History   Socioeconomic History  . Marital status: Married    Spouse name: Not on file  .  Number of children: Not on file  . Years of education: Not on file  . Highest education level: Not on file  Occupational History  . Not on file  Social Needs  . Financial resource strain: Not on file  . Food insecurity:    Worry: Not on file    Inability: Not on file  . Transportation needs:    Medical: Not on file    Non-medical: Not on file  Tobacco Use  . Smoking status: Never Smoker  . Smokeless tobacco: Never Used  Substance and Sexual Activity  . Alcohol use: Yes    Comment: occasional wine or beer  . Drug use: No  . Sexual activity: Not on file  Lifestyle  . Physical activity:    Days per week: Not on file    Minutes per session: Not on file  . Stress: Not on file  Relationships  . Social connections:    Talks on phone: Not on file    Gets together: Not on file    Attends religious service: Not on file    Active member of club or organization: Not on file    Attends meetings of clubs or organizations: Not on file    Relationship status: Not on file  . Intimate partner violence:    Fear of current or ex partner: Not on file    Emotionally abused: Not on file    Physically abused: Not on file    Forced sexual activity:  Not on file  Other Topics Concern  . Not on file  Social History Narrative   Lives in [place].    Work - [employer]   Diet - regular diet, starting a weight loss challenge   Enjoys visiting her beach home, riding bikes, sewing.     Past Surgical History:  Procedure Laterality Date  . CESAREAN SECTION  02-22-80  . CHOLECYSTECTOMY    . COLONOSCOPY WITH PROPOFOL N/A 01/09/2016   Procedure: COLONOSCOPY WITH PROPOFOL;  Surgeon: Lollie Sails, MD;  Location: [facility] ENDOSCOPY;  Service: Endoscopy;  Laterality: N/A;  . 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  Diabetes Mother   . Heart disease Father   . Hypertension Father   . Cancer Sister        breast  . Cancer Paternal Grandmother        ovarian    No Known Allergies  Current Outpatient Medications on File Prior to Visit  Medication Sig Dispense Refill  . cetirizine (ZYRTEC) 10 MG tablet Take 10 mg by mouth at bedtime.  4  . fluticasone (FLONASE) 50 MCG/ACT nasal spray Place 1 spray into both nostrils daily as needed for allergies or rhinitis.     No current facility-administered medications on file prior to visit.     BP 122/76   Pulse 84   Temp 98.2 F (36.8 C) (Oral)   Ht 5' 6.5" (1.689 m)   Wt 237 lb 8 oz (107.7 kg)   SpO2 98%   BMI 37.76 kg/m    Objective:   Physical Exam  Constitutional: She appears well-nourished.  Neck: Neck supple.  Cardiovascular: Normal rate and regular rhythm.  Pulmonary/Chest: Effort normal and breath sounds normal. She has no wheezes.  Skin: Skin is warm and dry.  Benign appearing ganglion cyst to right MCP joint of 2nd digit on right hand.  Psychiatric: She has a normal mood and affect.          Assessment & Plan:

## 2018-01-12 NOTE — Assessment & Plan Note (Signed)
A1C of 6.1 from labs in January 2018, endorses improvement in October 2018. Commended her on lifestyle changes and encouraged to continue.

## 2018-02-08 ENCOUNTER — Other Ambulatory Visit: Payer: Self-pay | Admitting: Primary Care

## 2018-02-08 DIAGNOSIS — J302 Other seasonal allergic rhinitis: Secondary | ICD-10-CM

## 2018-02-16 DIAGNOSIS — M67441 Ganglion, right hand: Secondary | ICD-10-CM | POA: Diagnosis not present

## 2018-02-21 ENCOUNTER — Encounter: Payer: Self-pay | Admitting: Primary Care

## 2018-02-21 DIAGNOSIS — Z1231 Encounter for screening mammogram for malignant neoplasm of breast: Secondary | ICD-10-CM | POA: Diagnosis not present

## 2019-01-29 ENCOUNTER — Other Ambulatory Visit: Payer: Self-pay | Admitting: Primary Care

## 2019-01-29 DIAGNOSIS — F3342 Major depressive disorder, recurrent, in full remission: Secondary | ICD-10-CM

## 2019-01-30 NOTE — Telephone Encounter (Signed)
Last prescribed and seen was 01/12/2018. Also I don't see labs that patient is talking about. Do you see?  I have have schedule patient for a Doxy.me for Wednesday 01/31/2019 for follow up for medication refill

## 2019-01-30 NOTE — Telephone Encounter (Signed)
Noted  

## 2019-01-31 ENCOUNTER — Ambulatory Visit (INDEPENDENT_AMBULATORY_CARE_PROVIDER_SITE_OTHER): Payer: BLUE CROSS/BLUE SHIELD | Admitting: Primary Care

## 2019-01-31 ENCOUNTER — Encounter: Payer: Self-pay | Admitting: Primary Care

## 2019-01-31 DIAGNOSIS — F3342 Major depressive disorder, recurrent, in full remission: Secondary | ICD-10-CM | POA: Diagnosis not present

## 2019-01-31 DIAGNOSIS — I10 Essential (primary) hypertension: Secondary | ICD-10-CM | POA: Diagnosis not present

## 2019-01-31 DIAGNOSIS — J302 Other seasonal allergic rhinitis: Secondary | ICD-10-CM

## 2019-01-31 DIAGNOSIS — E785 Hyperlipidemia, unspecified: Secondary | ICD-10-CM

## 2019-01-31 DIAGNOSIS — R7303 Prediabetes: Secondary | ICD-10-CM | POA: Diagnosis not present

## 2019-01-31 DIAGNOSIS — E559 Vitamin D deficiency, unspecified: Secondary | ICD-10-CM

## 2019-01-31 MED ORDER — VALSARTAN-HYDROCHLOROTHIAZIDE 80-12.5 MG PO TABS: 1.0000 | ORAL_TABLET | Freq: Every day | ORAL | 3 refills | Status: DC

## 2019-01-31 MED ORDER — SERTRALINE HCL 25 MG PO TABS: 25.0000 mg | ORAL_TABLET | Freq: Every day | ORAL | 3 refills | Status: DC

## 2019-01-31 NOTE — Assessment & Plan Note (Signed)
Labs to be faxed over today. Discussed to start vitamin D 2000 units daily.

## 2019-01-31 NOTE — Assessment & Plan Note (Signed)
Historically BP has been WNL, endorses normal BP reading from company NP. She will have her labs faxed to our office today. Refill sent to pharmacy.

## 2019-01-31 NOTE — Patient Instructions (Signed)
I sent refills of your medications to the pharmacy.  Please have your company nurse practitioner send me your labs as discussed.  Start exercising. You should be getting 150 minutes of moderate intensity exercise weekly.  It's important to improve your diet by reducing consumption of fast food, fried food, processed snack foods, sugary drinks. Increase consumption of fresh vegetables and fruits, whole grains, water.  Ensure you are drinking 64 ounces of water daily.  It was a pleasure to see you today!

## 2019-01-31 NOTE — Assessment & Plan Note (Signed)
Seems to be doing well on Trulicity 1.5 mg weekly. Continue same. Will wait labs.

## 2019-01-31 NOTE — Assessment & Plan Note (Signed)
Doing well on Zoloft, continue same. Denies SI/HI. Refills sent to pharmacy.

## 2019-01-31 NOTE — Assessment & Plan Note (Signed)
Labs from October 2019 and January 2020 to be faxed over to our office.  Discussed the importance of a healthy diet and regular exercise in order for weight loss, and to reduce the risk of any potential medical problems.

## 2019-01-31 NOTE — Progress Notes (Signed)
Subjective:    Patient ID: Kathleen Olsen, female    DOB: [birth year], 58 y.o.   MRN: [id number]  HPI  Virtual Visit via Video Note  I connected with Kathleen Olsen on 01/31/19 at  9:40 AM EDT by a video enabled telemedicine application and verified that I am speaking with the correct person using two identifiers.   I discussed the limitations of evaluation and management by telemedicine and the availability of in person appointments. The patient expressed understanding and agreed to proceed. She is at home, I am in the office.  History of Present Illness:  Kathleen Olsen is a 58 year old female who presents today via video for follow up and medication refills.  1) Essential Hypertension: Currently managed on valsartan-HCTZ 80/12.5 mg. She is compliant to her medication daily. She is not checking her BP at home but her company's nurse practitioner is monitoring her BP. She cannot remember her last reading which was in January 2020, but she endorses that it was normal.   BP Readings from Last 3 Encounters:  01/12/18 122/76  11/11/16 118/80  08/13/16 116/78     2) Depression: Currently managed on sertraline 25 mg. She is doing well on Zoloft, denies SI/HI.   3) Hyperlipidemia: Currently not managed on medication. Last lipid panel on file from 2018 with TC of 219, LDL of 144. She endorses having labs done with her company's nurse practitioner in October 2019. She states that her cholesterol was still slightly "high". She is working on a healthier lifestyle and has lost four pounds since January 2020.  4) Prediabetes: Last A1C on file was in 2018 with a reading of 6.1. She is following with the nurse practitioner through her company and was initiated on Trulicity 1.5 mg weekly for an A1C of 6.4 in October 2019. Repeat A1C of 5.9 in January 2020. She is compliant weekly.  5) Vitamin D Deficiency: Last vitamin D level on file is from 2018 with level of 21.  She had vitamin D labs checked in  October 2019 and was told it was low. She is not taking vitamin D.    Observations/Objective:  Alert and oriented. No distress. Appears well.  Assessment and Plan:  See problem based charting.  Follow Up Instructions:  I sent refills of your medications to the pharmacy.  Please have your company nurse practitioner send me your labs as discussed.  Start exercising. You should be getting 150 minutes of moderate intensity exercise weekly.  It's important to improve your diet by reducing consumption of fast food, fried food, processed snack foods, sugary drinks. Increase consumption of fresh vegetables and fruits, whole grains, water.  Ensure you are drinking 64 ounces of water daily.  It was a pleasure to see you today!    I discussed the assessment and treatment plan with the patient. The patient was provided an opportunity to ask questions and all were answered. The patient agreed with the plan and demonstrated an understanding of the instructions.   The patient was advised to call back or seek an in-person evaluation if the symptoms worsen or if the condition fails to improve as anticipated.     Pleas Koch, NP    Review of Systems  Constitutional: Negative for fatigue.  Eyes: Negative for visual disturbance.  Respiratory: Negative for shortness of breath.   Cardiovascular: Negative for chest pain.  Neurological: Negative for dizziness and headaches.  Psychiatric/Behavioral: Negative for suicidal ideas.  Past Medical History:  Diagnosis Date  . Depression   . Hyperlipidemia   . Hypertension   . Obesity, unspecified   . Other specified menopausal and postmenopausal disorder      Social History   Socioeconomic History  . Marital status: Married    Spouse name: Not on file  . Number of children: Not on file  . Years of education: Not on file  . Highest education level: Not on file  Occupational History  . Not on file  Social Needs  .  Financial resource strain: Not on file  . Food insecurity:    Worry: Not on file    Inability: Not on file  . Transportation needs:    Medical: Not on file    Non-medical: Not on file  Tobacco Use  . Smoking status: Never Smoker  . Smokeless tobacco: Never Used  Substance and Sexual Activity  . Alcohol use: Yes    Comment: occasional wine or beer  . Drug use: No  . Sexual activity: Not on file  Lifestyle  . Physical activity:    Days per week: Not on file    Minutes per session: Not on file  . Stress: Not on file  Relationships  . Social connections:    Talks on phone: Not on file    Gets together: Not on file    Attends religious service: Not on file    Active member of club or organization: Not on file    Attends meetings of clubs or organizations: Not on file    Relationship status: Not on file  . Intimate partner violence:    Fear of current or ex partner: Not on file    Emotionally abused: Not on file    Physically abused: Not on file    Forced sexual activity: Not on file  Other Topics Concern  . Not on file  Social History Narrative   Lives in [place].    Work - [employer]   Diet - regular diet, starting a weight loss challenge   Enjoys visiting her beach home, riding bikes, sewing.     Past Surgical History:  Procedure Laterality Date  . CESAREAN SECTION  02-22-80  . CHOLECYSTECTOMY    . COLONOSCOPY WITH PROPOFOL N/A 01/09/2016   Procedure: COLONOSCOPY WITH PROPOFOL;  Surgeon: Lollie Sails, MD;  Location: [facility] ENDOSCOPY;  Service: Endoscopy;  Laterality: N/A;  . 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 Diabetes Mother   . Heart disease Father   . Hypertension Father   . Cancer Sister        breast  . Cancer Paternal Grandmother        ovarian    No Known Allergies   Current Outpatient Medications on File Prior to Visit  Medication Sig Dispense Refill  . cetirizine (ZYRTEC) 10 MG tablet Take 10 mg by mouth at bedtime.  4  . fluticasone (FLONASE) 50 MCG/ACT nasal spray Place 1 spray into both nostrils daily as needed for allergies or rhinitis.    . VENTOLIN HFA 108 (90 Base) MCG/ACT inhaler INHALE 2 PUFFS BY MOUTH EVERY 6 HOURS AS NEEDED FOR WHEEZE OR SHORTNESS OF BREATH 1 Inhaler 2   No current facility-administered medications on file prior to visit.     There were no vitals  taken for this visit.   Objective:   Physical Exam  Constitutional: She is oriented to person, place, and time. She appears well-nourished.  Respiratory: Effort normal.  Neurological: She is alert and oriented to person, place, and time.  Psychiatric: She has a normal mood and affect.           Assessment & Plan:

## 2019-01-31 NOTE — Assessment & Plan Note (Signed)
Using albuterol inhaler infrequently, mostly during allergy season. Continue to monitor.

## 2019-02-01 DIAGNOSIS — E559 Vitamin D deficiency, unspecified: Secondary | ICD-10-CM

## 2019-02-02 MED ORDER — VITAMIN D (ERGOCALCIFEROL) 1.25 MG (50000 UNIT) PO CAPS: ORAL_CAPSULE | ORAL | 0 refills | Status: DC

## 2019-04-22 ENCOUNTER — Other Ambulatory Visit: Payer: Self-pay | Admitting: Primary Care

## 2019-04-22 DIAGNOSIS — E559 Vitamin D deficiency, unspecified: Secondary | ICD-10-CM

## 2019-04-24 NOTE — Telephone Encounter (Signed)
Please notify patient that I need updated labs for her most recent vitamin D level. Has she had any labs for vitamin D drawn since January 2020? If not then I need an updated D level in our office.

## 2019-04-24 NOTE — Telephone Encounter (Signed)
Does pt need levels checked? please advise

## 2019-04-25 NOTE — Telephone Encounter (Signed)
Noted  

## 2019-04-25 NOTE — Telephone Encounter (Signed)
Spoken to patient. She stated that she have 3 pills left so no refill needed right. Patient stated that she is getting labs done in the end of next and will let us know the results.

## 2020-01-30 ENCOUNTER — Other Ambulatory Visit: Payer: Self-pay | Admitting: Primary Care

## 2020-01-30 DIAGNOSIS — I10 Essential (primary) hypertension: Secondary | ICD-10-CM

## 2020-01-30 DIAGNOSIS — F3342 Major depressive disorder, recurrent, in full remission: Secondary | ICD-10-CM

## 2020-03-06 ENCOUNTER — Other Ambulatory Visit: Payer: Self-pay | Admitting: Primary Care

## 2020-03-06 DIAGNOSIS — I10 Essential (primary) hypertension: Secondary | ICD-10-CM

## 2020-03-06 DIAGNOSIS — F3342 Major depressive disorder, recurrent, in full remission: Secondary | ICD-10-CM

## 2020-04-04 ENCOUNTER — Other Ambulatory Visit: Payer: Self-pay | Admitting: Primary Care

## 2020-04-04 DIAGNOSIS — F3342 Major depressive disorder, recurrent, in full remission: Secondary | ICD-10-CM

## 2020-04-08 ENCOUNTER — Other Ambulatory Visit: Payer: Self-pay | Admitting: Primary Care

## 2020-04-08 DIAGNOSIS — F3342 Major depressive disorder, recurrent, in full remission: Secondary | ICD-10-CM

## 2020-04-08 DIAGNOSIS — I10 Essential (primary) hypertension: Secondary | ICD-10-CM

## 2020-04-08 MED ORDER — VALSARTAN-HYDROCHLOROTHIAZIDE 80-12.5 MG PO TABS: 1.0000 | ORAL_TABLET | Freq: Every day | ORAL | 0 refills | Status: DC

## 2020-04-08 MED ORDER — SERTRALINE HCL 25 MG PO TABS: 25.0000 mg | ORAL_TABLET | Freq: Every day | ORAL | 0 refills | Status: DC

## 2020-05-02 ENCOUNTER — Other Ambulatory Visit: Payer: Self-pay | Admitting: Primary Care

## 2020-05-02 DIAGNOSIS — I10 Essential (primary) hypertension: Secondary | ICD-10-CM

## 2020-05-02 DIAGNOSIS — F3342 Major depressive disorder, recurrent, in full remission: Secondary | ICD-10-CM

## 2020-07-04 NOTE — Telephone Encounter (Signed)
Called patient to schedule appointment. LVM to call back.

## 2020-07-07 NOTE — Telephone Encounter (Signed)
Viewed appointment desk and patient scheduled for cpe.

## 2020-07-29 DIAGNOSIS — Z23 Encounter for immunization: Secondary | ICD-10-CM | POA: Diagnosis not present

## 2020-07-29 DIAGNOSIS — Z0189 Encounter for other specified special examinations: Secondary | ICD-10-CM | POA: Diagnosis not present

## 2020-08-06 DIAGNOSIS — I10 Essential (primary) hypertension: Secondary | ICD-10-CM | POA: Diagnosis not present

## 2020-08-06 DIAGNOSIS — Z043 Encounter for examination and observation following other accident: Secondary | ICD-10-CM | POA: Diagnosis not present

## 2020-08-06 DIAGNOSIS — Z713 Dietary counseling and surveillance: Secondary | ICD-10-CM | POA: Diagnosis not present

## 2020-08-18 DIAGNOSIS — H0012 Chalazion right lower eyelid: Secondary | ICD-10-CM | POA: Diagnosis not present

## 2020-08-27 ENCOUNTER — Other Ambulatory Visit: Payer: Self-pay

## 2020-08-28 ENCOUNTER — Ambulatory Visit (INDEPENDENT_AMBULATORY_CARE_PROVIDER_SITE_OTHER): Payer: BC Managed Care – PPO | Admitting: Primary Care

## 2020-08-28 ENCOUNTER — Encounter: Payer: Self-pay | Admitting: Primary Care

## 2020-08-28 ENCOUNTER — Other Ambulatory Visit (HOSPITAL_COMMUNITY)
Admission: RE | Admit: 2020-08-28 | Discharge: 2020-08-28 | Disposition: A | Discharge status: Home / Self Care | Payer: BC Managed Care – PPO | Source: Ambulatory Visit | Attending: Primary Care | Admitting: Primary Care

## 2020-08-28 ENCOUNTER — Other Ambulatory Visit: Payer: Self-pay

## 2020-08-28 VITALS — BP 127/74 | HR 76 | Temp 97.6°F | Ht 66.5 in | Wt 235.0 lb

## 2020-08-28 DIAGNOSIS — Z0000 Encounter for general adult medical examination without abnormal findings: Secondary | ICD-10-CM | POA: Diagnosis not present

## 2020-08-28 DIAGNOSIS — J302 Other seasonal allergic rhinitis: Secondary | ICD-10-CM | POA: Diagnosis not present

## 2020-08-28 DIAGNOSIS — E785 Hyperlipidemia, unspecified: Secondary | ICD-10-CM

## 2020-08-28 DIAGNOSIS — E559 Vitamin D deficiency, unspecified: Secondary | ICD-10-CM

## 2020-08-28 DIAGNOSIS — F32A Depression, unspecified: Secondary | ICD-10-CM

## 2020-08-28 DIAGNOSIS — Z124 Encounter for screening for malignant neoplasm of cervix: Secondary | ICD-10-CM | POA: Insufficient documentation

## 2020-08-28 DIAGNOSIS — Z8041 Family history of malignant neoplasm of ovary: Secondary | ICD-10-CM

## 2020-08-28 DIAGNOSIS — N83299 Other ovarian cyst, unspecified side: Secondary | ICD-10-CM

## 2020-08-28 DIAGNOSIS — M1711 Unilateral primary osteoarthritis, right knee: Secondary | ICD-10-CM

## 2020-08-28 DIAGNOSIS — Z23 Encounter for immunization: Secondary | ICD-10-CM

## 2020-08-28 DIAGNOSIS — I10 Essential (primary) hypertension: Secondary | ICD-10-CM | POA: Diagnosis not present

## 2020-08-28 DIAGNOSIS — Z1231 Encounter for screening mammogram for malignant neoplasm of breast: Secondary | ICD-10-CM

## 2020-08-28 DIAGNOSIS — R7303 Prediabetes: Secondary | ICD-10-CM

## 2020-08-28 MED ORDER — ALBUTEROL SULFATE HFA 108 (90 BASE) MCG/ACT IN AERS: 1.0000 | INHALATION_SPRAY | Freq: Four times a day (QID) | RESPIRATORY_TRACT | 0 refills | Status: AC | PRN

## 2020-08-28 NOTE — Patient Instructions (Addendum)
Start exercising. You should be getting 150 minutes of moderate intensity exercise weekly.  Continue to work on a healthy diet.  Ensure you are drinking 64 ounces of water daily.  [facility] to schedule your mammogram.   You will be contacted regarding your ultrasound.  Please let us know if you have not been contacted within two weeks.   Make sure you are taking 1200 mg of calcium and at least 800 IU of vitamin D daily.  Reduce your Zoloft to 1/2 tablet daily for 1-2 weeks, then stop.  Schedule a nurse visit to return in 2-6 months for your final shingles vaccine.  It was a pleasure to see you today!   Preventive Care 43-61 Years Old, Female Preventive care refers to visits with your health care provider and lifestyle choices that can promote health and wellness. This includes:  A yearly physical exam. This may also be called an annual well check.  Regular dental visits and eye exams.  Immunizations.  Screening for certain conditions.  Healthy lifestyle choices, such as eating a healthy diet, getting regular exercise, not using drugs or products that contain nicotine and tobacco, and limiting alcohol use. What can I expect for my preventive care visit? Physical exam Your health care provider will check your:  Height and weight. This may be used to calculate body mass index (BMI), which tells if you are at a healthy weight.  Heart rate and blood pressure.  Skin for abnormal spots. Counseling Your health care provider may ask you questions about your:  Alcohol, tobacco, and drug use.  Emotional well-being.  Home and relationship well-being.  Sexual activity.  Eating habits.  Work and work Statistician.  Method of birth control.  Menstrual cycle.  Pregnancy history. What immunizations do I need?  Influenza (flu) vaccine  This is recommended every year. Tetanus, diphtheria, and pertussis (Tdap) vaccine  You may need a Td booster every 10  years. Varicella (chickenpox) vaccine  You may need this if you have not been vaccinated. Zoster (shingles) vaccine  You may need this after age 15. Measles, mumps, and rubella (MMR) vaccine  You may need at least one dose of MMR if you were born in 1957 or later. You may also need a second dose. Pneumococcal conjugate (PCV13) vaccine  You may need this if you have certain conditions and were not previously vaccinated. Pneumococcal polysaccharide (PPSV23) vaccine  You may need one or two doses if you smoke cigarettes or if you have certain conditions. Meningococcal conjugate (MenACWY) vaccine  You may need this if you have certain conditions. Hepatitis A vaccine  You may need this if you have certain conditions or if you travel or work in places where you may be exposed to hepatitis A. Hepatitis B vaccine  You may need this if you have certain conditions or if you travel or work in places where you may be exposed to hepatitis B. Haemophilus influenzae type b (Hib) vaccine  You may need this if you have certain conditions. Human papillomavirus (HPV) vaccine  If recommended by your health care provider, you may need three doses over 6 months. You may receive vaccines as individual doses or as more than one vaccine together in one shot (combination vaccines). Talk with your health care provider about the risks and benefits of combination vaccines. What tests do I need? Blood tests  Lipid and cholesterol levels. These may be checked every 5 years, or more frequently if you are over 50  years old.  Hepatitis C test.  Hepatitis B test. Screening  Lung cancer screening. You may have this screening every year starting at age 35 if you have a 30-pack-year history of smoking and currently smoke or have quit within the past 15 years.  Colorectal cancer screening. All adults should have this screening starting at age 29 and continuing until age 40. Your health care provider may  recommend screening at age 78 if you are at increased risk. You will have tests every 1-10 years, depending on your results and the type of screening test.  Diabetes screening. This is done by checking your blood sugar (glucose) after you have not eaten for a while (fasting). You may have this done every 1-3 years.  Mammogram. This may be done every 1-2 years. Talk with your health care provider about when you should start having regular mammograms. This may depend on whether you have a family history of breast cancer.  BRCA-related cancer screening. This may be done if you have a family history of breast, ovarian, tubal, or peritoneal cancers.  Pelvic exam and Pap test. This may be done every 3 years starting at age 33. Starting at age 83, this may be done every 5 years if you have a Pap test in combination with an HPV test. Other tests  Sexually transmitted disease (STD) testing.  Bone density scan. This is done to screen for osteoporosis. You may have this scan if you are at high risk for osteoporosis. Follow these instructions at home: Eating and drinking  Eat a diet that includes fresh fruits and vegetables, whole grains, lean protein, and low-fat dairy.  Take vitamin and mineral supplements as recommended by your health care provider.  Do not drink alcohol if: ? Your health care provider tells you not to drink. ? You are pregnant, may be pregnant, or are planning to become pregnant.  If you drink alcohol: ? Limit how much you have to 0-1 drink a day. ? Be aware of how much alcohol is in your drink. In the [place] one drink equals one 12 oz bottle of beer (355 mL), one 5 oz glass of wine (148 mL), or one 1 oz glass of hard liquor (44 mL). Lifestyle  Take daily care of your teeth and gums.  Stay active. Exercise for at least 30 minutes on 5 or more days each week.  Do not use any products that contain nicotine or tobacco, such as cigarettes, e-cigarettes, and chewing tobacco. If  you need help quitting, ask your health care provider.  If you are sexually active, practice safe sex. Use a condom or other form of birth control (contraception) in order to prevent pregnancy and STIs (sexually transmitted infections).  If told by your health care provider, take low-dose aspirin daily starting at age 75. What's next?  Visit your health care provider once a year for a well check visit.  Ask your health care provider how often you should have your eyes and teeth checked.  Stay up to date on all vaccines. This information is not intended to replace advice given to you by your health care provider. Make sure you discuss any questions you have with your health care provider. Document Revised: 06/15/2018 Document Reviewed: 06/15/2018 Elsevier Patient Education  2020 [place] need to schedule for: _0   2D Mammogram  _1   3D Mammogram  _2   Bone Density   Call for appointment    Your appointment will at the following location  [  x]  [facility]  [place] [address]  [phone]  _0   [place] [facility])   [address] [place],  [address]  [phone]  _1   [facility]      [place], [place]         _2   [facility]  [place], [place]   Make sure to wear two peace clothing  No lotions powders or deodorants the day of the appointment Make sure to bring picture ID and insurance card.  Bring list of medications you are currently taking including any supplements.

## 2020-08-28 NOTE — Assessment & Plan Note (Signed)
Well controlled in the office today. Continue valsartan-HCTZ 80-12.5 mg daily.  CMP reviewed from labs obtained from occupation.

## 2020-08-28 NOTE — Assessment & Plan Note (Signed)
Doing well, infrequent use of albuterol inhaler. No wheezing on exam.

## 2020-08-28 NOTE — Progress Notes (Signed)
Subjective:    Patient ID: Kathleen Olsen, female    DOB: [birth year], 59 y.o.   MRN: [id number]  HPI  This visit occurred during the SARS-CoV-2 public health emergency.  Safety protocols were in place, including screening questions prior to the visit, additional usage of staff PPE, and extensive cleaning of exam room while observing appropriate contact time as indicated for disinfecting solutions.   Kathleen Olsen is a 59 year old female who presents today for complete physical.  Immunizations: -Tetanus: Due today -Influenza: Completed this season  -Shingles: Completed  -Covid-19: Completed   Diet: She endorses a fair diet.  Exercise: She is not exercising.   Eye exam: Due soon. Recent evaluation by optometry for stye.  Dental exam: Completes semi-annually   Pap Smear: Completed in 2018, due today Mammogram: Completed in 2019 Colonoscopy: Completed in 2017, due in 2022. Hep C Screen:    Review of Systems  Constitutional: Negative for unexpected weight change.  HENT: Negative for rhinorrhea.   Eyes: Negative for visual disturbance.  Respiratory: Negative for cough and shortness of breath.   Cardiovascular: Negative for chest pain.  Gastrointestinal: Positive for constipation. Negative for diarrhea.       Chronic constipation   Genitourinary: Negative for difficulty urinating.  Musculoskeletal: Negative for arthralgias and myalgias.  Skin: Negative for rash.  Allergic/Immunologic: Positive for environmental allergies.  Neurological: Negative for dizziness, numbness and headaches.  Psychiatric/Behavioral: The patient is not nervous/anxious.        Past Medical History:  Diagnosis Date  . Depression   . Hyperlipidemia   . Hypertension   . Obesity, unspecified   . Other specified menopausal and postmenopausal disorder      Social History   Socioeconomic History  . Marital status: Married    Spouse name: Not on file  . Number of children: Not on file  . Years of  education: Not on file  . Highest education level: Not on file  Occupational History  . Not on file  Tobacco Use  . Smoking status: Never Smoker  . Smokeless tobacco: Never Used  Substance and Sexual Activity  . Alcohol use: Yes    Comment: occasional wine or beer  . Drug use: No  . Sexual activity: Not on file  Other Topics Concern  . Not on file  Social History Narrative   Lives in [place].    Work - [employer]   Diet - regular diet, starting a weight loss challenge   Enjoys visiting her beach home, riding bikes, sewing.    Social Determinants of Health   Financial Resource Strain:   . Difficulty of Paying Living Expenses: Not on file  Food Insecurity:   . Worried About Charity fundraiser in the Last Year: Not on file  . Ran Out of Food in the Last Year: Not on file  Transportation Needs:   . Lack of Transportation (Medical): Not on file  . Lack of Transportation (Non-Medical): Not on file  Physical Activity:   . Days of Exercise per Week: Not on file  . Minutes of Exercise per Session: Not on file  Stress:   . Feeling of Stress : Not on file  Social Connections:   . Frequency of Communication with Friends and Family: Not on file  . Frequency of Social Gatherings with Friends and Family: Not on file  . Attends Religious Services: Not on file  . Active Member of Clubs or Organizations: Not on file  . Attends  Club or Organization Meetings: Not on file  . Marital Status: Not on file  Intimate Partner Violence:   . Fear of Current or Ex-Partner: Not on file  . Emotionally Abused: Not on file  . Physically Abused: Not on file  . Sexually Abused: Not on file    Past Surgical History:  Procedure Laterality Date  . CESAREAN SECTION  02-22-80  . CHOLECYSTECTOMY    . COLONOSCOPY WITH PROPOFOL N/A 01/09/2016   Procedure: COLONOSCOPY WITH PROPOFOL;  Surgeon: Lollie Sails, MD;  Location: [facility] ENDOSCOPY;  Service: Endoscopy;  Laterality: N/A;  . TOTAL KNEE ARTHROPLASTY  Right 06/01/2016   Procedure: TOTAL KNEE ARTHROPLASTY;  Surgeon: Hessie Knows, MD;  Location: [facility] ORS;  Service: Orthopedics;  Laterality: Right;    Family History  Problem Relation Age of Onset  . Diabetes Mother 38  . Heart disease Father   . Hypertension Father   . Breast cancer Sister   . Ovarian cancer Paternal Grandmother   . Uterine cancer Paternal Aunt     No Known Allergies  Current Outpatient Medications on File Prior to Visit  Medication Sig Dispense Refill  . cetirizine (ZYRTEC) 10 MG tablet Take 10 mg by mouth at bedtime.  4  . fluticasone (FLONASE) 50 MCG/ACT nasal spray Place 1 spray into both nostrils daily as needed for allergies or rhinitis.    Marland Kitchen sertraline (ZOLOFT) 25 MG tablet Take 1 tablet (25 mg total) by mouth daily. NEED APPOINTMENT FOR ANY MORE REFILLS 30 tablet 0  . TRULICITY 1.5 [place]/7.0WC SOPN Inject 1.5 mg into the skin once a week.    . valsartan-hydrochlorothiazide (DIOVAN-HCT) 80-12.5 MG tablet Take 1 tablet by mouth daily. For blood pressure. NEED APPOINTMENT FOR ANY MORE REFILLS 30 tablet 0  . neomycin-polymyxin b-dexamethasone (MAXITROL) 3.5-10000-0.1 SUSP SMARTSIG:In Eye(s)    . VENTOLIN HFA 108 (90 Base) MCG/ACT inhaler INHALE 2 PUFFS BY MOUTH EVERY 6 HOURS AS NEEDED FOR WHEEZE OR SHORTNESS OF BREATH (Patient not taking: Reported on 08/28/2020) 1 Inhaler 2   No current facility-administered medications on file prior to visit.    BP 127/74   Pulse 76   Temp 97.6 F (36.4 C) (Temporal)   Ht 5' 6.5" (1.689 m)   Wt 235 lb (106.6 kg)   SpO2 98%   BMI 37.36 kg/m    Objective:   Physical Exam HENT:     Right Ear: Tympanic membrane and ear canal normal.     Left Ear: Tympanic membrane and ear canal normal.  Eyes:     Pupils: Pupils are equal, round, and reactive to light.  Cardiovascular:     Rate and Rhythm: Normal rate and regular rhythm.  Pulmonary:     Effort: Pulmonary effort is normal.     Breath sounds: Normal breath sounds.    Abdominal:     General: Bowel sounds are normal.     Palpations: Abdomen is soft.     Tenderness: There is no abdominal tenderness.  Musculoskeletal:        General: Normal range of motion.     Cervical back: Neck supple.  Skin:    General: Skin is warm and dry.  Neurological:     Mental Status: She is alert and oriented to person, place, and time.     Cranial Nerves: No cranial nerve deficit.     Deep Tendon Reflexes:     Reflex Scores:      Patellar reflexes are 2+ on the right side and 2+  on the left side. Psychiatric:        Mood and Affect: Mood normal.            Assessment & Plan:

## 2020-08-28 NOTE — Assessment & Plan Note (Signed)
Recent A1C stable. Continue Trulicity weekly. Work on diet and exercise.

## 2020-08-28 NOTE — Assessment & Plan Note (Signed)
Denies concerns for depression, would like to wean off of Zoloft.   Will have her reduce to 1/2 tablet daily for 1-2 weeks, then stop

## 2020-08-28 NOTE — Assessment & Plan Note (Signed)
Continues to do well since surgery. Continue to monitor.

## 2020-08-28 NOTE — Addendum Note (Signed)
Addended by: Francella Solian on: 08/28/2020 11:53 AM   Modules accepted: Orders

## 2020-08-28 NOTE — Assessment & Plan Note (Signed)
Low on recent labs. Add in vitamin D supplementation with calcium.

## 2020-08-28 NOTE — Assessment & Plan Note (Signed)
LDL of 143 on recent labs. Given history of prediabetes and HTN we discussed the risks for CVD.  She will work on lifestyle changes for now. Continue to monitor.

## 2020-08-28 NOTE — Assessment & Plan Note (Signed)
Shingrix and Tetanus provided today. Mammogram due, ordered. Pap smear due, completed. Colonoscopy UTD, due in 2022.  Discussed the importance of a healthy diet and regular exercise in order for weight loss, and to reduce the risk of any potential medical problems.  Exam today stable. Labs reviewed. See scanned results in chart.

## 2020-08-29 ENCOUNTER — Other Ambulatory Visit: Payer: Self-pay | Admitting: Primary Care

## 2020-08-29 LAB — CYTOLOGY - PAP
Comment: NEGATIVE
Diagnosis: NEGATIVE
High risk HPV: NEGATIVE

## 2020-09-02 ENCOUNTER — Ambulatory Visit
Admission: RE | Admit: 2020-09-02 | Discharge: 2020-09-02 | Disposition: A | Discharge status: Home / Self Care | Payer: BC Managed Care – PPO | Source: Ambulatory Visit | Attending: Primary Care | Admitting: Primary Care

## 2020-09-02 ENCOUNTER — Other Ambulatory Visit: Payer: Self-pay

## 2020-09-02 DIAGNOSIS — Z0389 Encounter for observation for other suspected diseases and conditions ruled out: Secondary | ICD-10-CM | POA: Diagnosis not present

## 2020-09-02 DIAGNOSIS — N83299 Other ovarian cyst, unspecified side: Secondary | ICD-10-CM | POA: Diagnosis not present

## 2020-09-02 DIAGNOSIS — Z8041 Family history of malignant neoplasm of ovary: Secondary | ICD-10-CM | POA: Diagnosis not present

## 2020-09-29 DIAGNOSIS — U071 COVID-19: Secondary | ICD-10-CM | POA: Diagnosis not present

## 2020-10-21 ENCOUNTER — Inpatient Hospital Stay
Admission: RE | Admit: 2020-10-21 | Discharge: 2020-10-21 | Disposition: A | Discharge status: Home / Self Care | Payer: Self-pay | Source: Ambulatory Visit | Attending: *Deleted | Admitting: *Deleted

## 2020-10-21 ENCOUNTER — Other Ambulatory Visit: Payer: Self-pay | Admitting: *Deleted

## 2020-10-21 ENCOUNTER — Ambulatory Visit
Admission: RE | Admit: 2020-10-21 | Discharge: 2020-10-21 | Disposition: A | Discharge status: Home / Self Care | Payer: BC Managed Care – PPO | Source: Ambulatory Visit | Attending: Primary Care | Admitting: Primary Care

## 2020-10-21 ENCOUNTER — Other Ambulatory Visit: Payer: Self-pay

## 2020-10-21 DIAGNOSIS — Z1231 Encounter for screening mammogram for malignant neoplasm of breast: Secondary | ICD-10-CM

## 2020-10-28 DIAGNOSIS — I10 Essential (primary) hypertension: Secondary | ICD-10-CM

## 2020-10-29 ENCOUNTER — Other Ambulatory Visit: Payer: Self-pay

## 2020-10-29 DIAGNOSIS — I10 Essential (primary) hypertension: Secondary | ICD-10-CM

## 2020-10-30 ENCOUNTER — Ambulatory Visit: Payer: BC Managed Care – PPO

## 2020-10-30 MED ORDER — VALSARTAN-HYDROCHLOROTHIAZIDE 80-12.5 MG PO TABS: 1.0000 | ORAL_TABLET | Freq: Every day | ORAL | 2 refills | Status: DC

## 2020-12-30 ENCOUNTER — Other Ambulatory Visit: Payer: Self-pay

## 2020-12-30 ENCOUNTER — Ambulatory Visit (INDEPENDENT_AMBULATORY_CARE_PROVIDER_SITE_OTHER): Payer: BC Managed Care – PPO | Admitting: *Deleted

## 2020-12-30 DIAGNOSIS — Z23 Encounter for immunization: Secondary | ICD-10-CM | POA: Diagnosis not present

## 2021-01-05 DIAGNOSIS — F32A Depression, unspecified: Secondary | ICD-10-CM

## 2021-01-06 MED ORDER — SERTRALINE HCL 25 MG PO TABS: 25.0000 mg | ORAL_TABLET | Freq: Every day | ORAL | 2 refills | Status: DC

## 2021-02-10 DIAGNOSIS — Z0189 Encounter for other specified special examinations: Secondary | ICD-10-CM | POA: Diagnosis not present

## 2021-05-28 DIAGNOSIS — J45998 Other asthma: Secondary | ICD-10-CM | POA: Diagnosis not present

## 2021-05-28 DIAGNOSIS — Z0189 Encounter for other specified special examinations: Secondary | ICD-10-CM | POA: Diagnosis not present

## 2021-07-18 ENCOUNTER — Other Ambulatory Visit: Payer: Self-pay | Admitting: Primary Care

## 2021-07-18 DIAGNOSIS — I10 Essential (primary) hypertension: Secondary | ICD-10-CM

## 2021-08-03 DIAGNOSIS — Z0189 Encounter for other specified special examinations: Secondary | ICD-10-CM | POA: Diagnosis not present

## 2021-08-04 DIAGNOSIS — I10 Essential (primary) hypertension: Secondary | ICD-10-CM | POA: Diagnosis not present

## 2021-08-04 DIAGNOSIS — Z043 Encounter for examination and observation following other accident: Secondary | ICD-10-CM | POA: Diagnosis not present

## 2021-08-04 DIAGNOSIS — Z713 Dietary counseling and surveillance: Secondary | ICD-10-CM | POA: Diagnosis not present

## 2021-08-19 ENCOUNTER — Other Ambulatory Visit: Payer: Self-pay | Admitting: Primary Care

## 2021-08-19 DIAGNOSIS — I10 Essential (primary) hypertension: Secondary | ICD-10-CM

## 2021-10-01 DIAGNOSIS — Z23 Encounter for immunization: Secondary | ICD-10-CM | POA: Diagnosis not present

## 2021-10-01 DIAGNOSIS — F419 Anxiety disorder, unspecified: Secondary | ICD-10-CM | POA: Diagnosis not present

## 2021-10-01 DIAGNOSIS — I10 Essential (primary) hypertension: Secondary | ICD-10-CM | POA: Diagnosis not present

## 2021-10-02 ENCOUNTER — Other Ambulatory Visit: Payer: Self-pay | Admitting: Primary Care

## 2021-10-02 DIAGNOSIS — F32A Depression, unspecified: Secondary | ICD-10-CM

## 2021-10-05 DIAGNOSIS — Z86010 Personal history of colonic polyps: Secondary | ICD-10-CM | POA: Diagnosis not present

## 2021-12-31 DIAGNOSIS — Z1231 Encounter for screening mammogram for malignant neoplasm of breast: Secondary | ICD-10-CM | POA: Diagnosis not present

## 2021-12-31 DIAGNOSIS — Z803 Family history of malignant neoplasm of breast: Secondary | ICD-10-CM | POA: Diagnosis not present

## 2021-12-31 DIAGNOSIS — Z1331 Encounter for screening for depression: Secondary | ICD-10-CM | POA: Diagnosis not present

## 2021-12-31 DIAGNOSIS — Z01419 Encounter for gynecological examination (general) (routine) without abnormal findings: Secondary | ICD-10-CM | POA: Diagnosis not present

## 2022-01-07 DIAGNOSIS — I10 Essential (primary) hypertension: Secondary | ICD-10-CM | POA: Diagnosis not present

## 2022-01-07 DIAGNOSIS — R7303 Prediabetes: Secondary | ICD-10-CM | POA: Diagnosis not present

## 2022-01-07 DIAGNOSIS — E785 Hyperlipidemia, unspecified: Secondary | ICD-10-CM | POA: Diagnosis not present

## 2022-01-07 DIAGNOSIS — R0789 Other chest pain: Secondary | ICD-10-CM | POA: Diagnosis not present

## 2022-01-11 DIAGNOSIS — H1089 Other conjunctivitis: Secondary | ICD-10-CM | POA: Diagnosis not present

## 2022-01-12 DIAGNOSIS — H0014 Chalazion left upper eyelid: Secondary | ICD-10-CM | POA: Diagnosis not present

## 2022-01-14 ENCOUNTER — Encounter: Payer: Self-pay | Admitting: Gastroenterology

## 2022-01-14 NOTE — H&P (Signed)
? ?Pre-Procedure H&P ?  ?Patient ID: Kathleen Olsen is a 61 y.o. female. ? ?Gastroenterology Provider: Annamaria Helling, DO ? ?Referring Provider: Dr. Gilford Rile  ?PCP: Donnamarie Rossetti, PA-C ? ?Date: 01/15/2022 ? ?HPI ?Kathleen Olsen is a 61 y.o. female who presents today for Colonoscopy for Surveillance-personal history of colon polyps. ?Undergoing surveillance colonoscopy.  Was found to have sessile serrated polyp in the proximal ascending colon (4 mm) during colonoscopy in March 2017.  Diverticulosis also noted.  Patient noted to have tortuous colon.  BMI 38 ?Overall deals with constipation no melena or hematochezia. ?Status post right knee replacement, cholecystectomy and C-section. ?Paternal uncle with history of colorectal cancer.  No other family history of colorectal cancer or polyps ? ?Past Medical History:  ?Diagnosis Date  ? Asthma   ? Depression   ? Diabetes mellitus without complication ([place])   ? Hyperlipidemia   ? Hypertension   ? Obesity, unspecified   ? Other specified menopausal and postmenopausal disorder   ? ? ?Past Surgical History:  ?Procedure Laterality Date  ? CESAREAN SECTION  02-22-80  ? CHOLECYSTECTOMY    ? COLONOSCOPY WITH PROPOFOL N/A 01/09/2016  ? Procedure: COLONOSCOPY WITH PROPOFOL;  Surgeon: Lollie Sails, MD;  [facility] END[facility]COPY;  Service: Endoscopy;  Laterality: N/A;  ? TOTAL KNEE ARTHROPLASTY Right 06/01/2016  ? Procedure: TOTAL KNEE ARTHROPLASTY;  Surgeon: Hessie Knows, MD;  Location: [facility] ORS;  Service: Orthopedics;  Laterality: Right;  ? ? ?Family History ?Paternal uncle with history of colorectal cancer.   ?No other h/o GI disease or malignancy ? ?Review of Systems  ?Constitutional:  Negative for activity change, appetite change, chills, diaphoresis, fatigue, fever and unexpected weight change.  ?HENT:  Negative for trouble swallowing and voice change.   ?Respiratory:  Negative for shortness of breath and wheezing.   ?Cardiovascular:  Negative for chest pain,  palpitations and leg swelling.  ?Gastrointestinal:  Negative for abdominal distention, abdominal pain, anal bleeding, blood in stool, constipation, diarrhea, nausea, rectal pain and vomiting.  ?Musculoskeletal:  Negative for arthralgias and myalgias.  ?Skin:  Negative for color change and pallor.  ?Neurological:  Negative for dizziness, syncope and weakness.  ?Psychiatric/Behavioral:  Negative for confusion.   ?All other systems reviewed and are negative.  ? ?Medications ?No current facility-administered medications on file prior to encounter.  ? ?Current Outpatient Medications on File Prior to Encounter  ?Medication Sig Dispense Refill  ? cephALEXin (KEFLEX) 500 MG capsule Take 500 mg by mouth once. One hour prior to dental appointment    ? cetirizine (ZYRTEC) 10 MG tablet Take 10 mg by mouth at bedtime.    ? fluticasone furoate-vilanterol (BREO ELLIPTA) 200-25 MCG/ACT AEPB Inhale 1 puff into the lungs daily.    ? Semaglutide,0.25 or 0.'5MG'$ /DOS, (OZEMPIC, 0.25 OR 0.5 MG/DOSE,) 2 MG/1.5ML SOPN Inject into the skin.    ? sertraline (ZOLOFT) 25 MG tablet Take 1 tablet (25 mg total) by mouth daily. For depression. Office visit required for further refills. 30 tablet 0  ? albuterol (VENTOLIN HFA) 108 (90 Base) MCG/ACT inhaler Inhale 1-2 puffs into the lungs every 6 (six) hours as needed for wheezing or shortness of breath. 1 each 0  ? fluticasone (FLONASE) 50 MCG/ACT nasal spray Place 1 spray into both nostrils daily as needed for allergies or rhinitis.    ? TRULICITY 1.5 FY/1.0FB SOPN Inject 1.5 mg into the skin once a week.    ? valsartan-hydrochlorothiazide (DIOVAN-HCT) 80-12.5 MG tablet Take 1 tablet by mouth  daily. For blood pressure. Office visit due in mid November for additional refills. 30 tablet 1  ? ? ?Pertinent medications related to GI and procedure were reviewed by me with the patient prior to the procedure ? ? ?Current Facility-Administered Medications:  ?  0.9 %  sodium chloride infusion, , Intravenous,  Continuous, Annamaria Helling, DO, Last Rate: 20 mL/hr at 01/15/22 0848, New Bag at 01/15/22 0848 ?  ?  ? ?No Known Allergies ?Allergies were reviewed by me prior to the procedure ? ?Objective  ? ? ?Vitals:  ? 01/15/22 0829  ?BP: (!) 147/85  ?Pulse: 85  ?Resp: 18  ?Temp: 98 ?F (36.7 ?C)  ?TempSrc: Temporal  ?SpO2: 96%  ?Weight: 108.4 kg  ?Height: '5\' 6"'$  (1.676 m)  ? ? ? ?Physical Exam ?Vitals and nursing note reviewed.  ?Constitutional:   ?   General: She is not in acute distress. ?   Appearance: Normal appearance. She is obese. She is not ill-appearing, toxic-appearing or diaphoretic.  ?HENT:  ?   Head: Normocephalic and atraumatic.  ?   Nose: Nose normal.  ?   Mouth/Throat:  ?   Mouth: Mucous membranes are moist.  ?   Pharynx: Oropharynx is clear.  ?Eyes:  ?   General: No scleral icterus. ?   Extraocular Movements: Extraocular movements intact.  ?Cardiovascular:  ?   Rate and Rhythm: Normal rate and regular rhythm.  ?   Heart sounds: Normal heart sounds. No murmur heard. ?  No friction rub. No gallop.  ?Pulmonary:  ?   Effort: Pulmonary effort is normal. No respiratory distress.  ?   Breath sounds: Normal breath sounds. No wheezing, rhonchi or rales.  ?Abdominal:  ?   General: Bowel sounds are normal. There is no distension.  ?   Palpations: Abdomen is soft.  ?   Tenderness: There is no abdominal tenderness. There is no guarding or rebound.  ?Musculoskeletal:  ?   Cervical back: Neck supple.  ?   Right lower leg: No edema.  ?   Left lower leg: No edema.  ?Skin: ?   General: Skin is warm and dry.  ?   Coloration: Skin is not jaundiced or pale.  ?Neurological:  ?   General: No focal deficit present.  ?   Mental Status: She is alert and oriented to person, place, and time. Mental status is at baseline.  ?Psychiatric:     ?   Mood and Affect: Mood normal.     ?   Behavior: Behavior normal.     ?   Thought Content: Thought content normal.     ?   Judgment: Judgment normal.  ? ? ? ?Assessment:  ?Kathleen Olsen is  a 61 y.o. female  who presents today for Colonoscopy for surveillance-personal history of colon polyps. ? ?Plan:  ?Colonoscopy with possible intervention today ? ?Colonoscopy with possible biopsy, control of bleeding, polypectomy, and interventions as necessary has been discussed with the patient/patient representative. Informed consent was obtained from the patient/patient representative after explaining the indication, nature, and risks of the procedure including but not limited to death, bleeding, perforation, missed neoplasm/lesions, cardiorespiratory compromise, and reaction to medications. Opportunity for questions was given and appropriate answers were provided. Patient/patient representative has verbalized understanding is amenable to undergoing the procedure. ? ? ?Annamaria Helling, DO  ?[facility] ? ?Portions of the record may have been created with voice recognition software. Occasional wrong-word or 'sound-a-like' substitutions may have occurred due to  the inherent limitations of voice recognition software.  Read the chart carefully and recognize, using context, where substitutions may have occurred. ?

## 2022-01-15 ENCOUNTER — Ambulatory Visit: Payer: BC Managed Care – PPO | Admitting: Anesthesiology

## 2022-01-15 ENCOUNTER — Encounter: Payer: Self-pay | Admitting: Gastroenterology

## 2022-01-15 ENCOUNTER — Encounter
Admission: RE | Disposition: A | Discharge status: Home / Self Care | Payer: Self-pay | Source: Home / Self Care | Attending: Gastroenterology

## 2022-01-15 ENCOUNTER — Ambulatory Visit
Admission: RE | Admit: 2022-01-15 | Discharge: 2022-01-15 | Disposition: A | Discharge status: Home / Self Care | Payer: BC Managed Care – PPO | Attending: Gastroenterology | Admitting: Gastroenterology

## 2022-01-15 DIAGNOSIS — Z1211 Encounter for screening for malignant neoplasm of colon: Secondary | ICD-10-CM | POA: Insufficient documentation

## 2022-01-15 DIAGNOSIS — Z79899 Other long term (current) drug therapy: Secondary | ICD-10-CM | POA: Insufficient documentation

## 2022-01-15 DIAGNOSIS — D125 Benign neoplasm of sigmoid colon: Secondary | ICD-10-CM | POA: Diagnosis not present

## 2022-01-15 DIAGNOSIS — K6389 Other specified diseases of intestine: Secondary | ICD-10-CM | POA: Diagnosis not present

## 2022-01-15 DIAGNOSIS — J45909 Unspecified asthma, uncomplicated: Secondary | ICD-10-CM | POA: Diagnosis not present

## 2022-01-15 DIAGNOSIS — K5900 Constipation, unspecified: Secondary | ICD-10-CM | POA: Diagnosis not present

## 2022-01-15 DIAGNOSIS — I10 Essential (primary) hypertension: Secondary | ICD-10-CM | POA: Diagnosis not present

## 2022-01-15 DIAGNOSIS — K621 Rectal polyp: Secondary | ICD-10-CM | POA: Diagnosis not present

## 2022-01-15 DIAGNOSIS — K5730 Diverticulosis of large intestine without perforation or abscess without bleeding: Secondary | ICD-10-CM | POA: Insufficient documentation

## 2022-01-15 DIAGNOSIS — Z86010 Personal history of colonic polyps: Secondary | ICD-10-CM | POA: Diagnosis not present

## 2022-01-15 DIAGNOSIS — L814 Other melanin hyperpigmentation: Secondary | ICD-10-CM | POA: Diagnosis not present

## 2022-01-15 DIAGNOSIS — Z9049 Acquired absence of other specified parts of digestive tract: Secondary | ICD-10-CM | POA: Insufficient documentation

## 2022-01-15 DIAGNOSIS — K640 First degree hemorrhoids: Secondary | ICD-10-CM | POA: Insufficient documentation

## 2022-01-15 DIAGNOSIS — Z7984 Long term (current) use of oral hypoglycemic drugs: Secondary | ICD-10-CM | POA: Diagnosis not present

## 2022-01-15 DIAGNOSIS — E119 Type 2 diabetes mellitus without complications: Secondary | ICD-10-CM | POA: Diagnosis not present

## 2022-01-15 DIAGNOSIS — D122 Benign neoplasm of ascending colon: Secondary | ICD-10-CM | POA: Diagnosis not present

## 2022-01-15 DIAGNOSIS — F32A Depression, unspecified: Secondary | ICD-10-CM | POA: Insufficient documentation

## 2022-01-15 DIAGNOSIS — E669 Obesity, unspecified: Secondary | ICD-10-CM | POA: Diagnosis not present

## 2022-01-15 DIAGNOSIS — Z6838 Body mass index (BMI) 38.0-38.9, adult: Secondary | ICD-10-CM | POA: Insufficient documentation

## 2022-01-15 DIAGNOSIS — Z96651 Presence of right artificial knee joint: Secondary | ICD-10-CM | POA: Insufficient documentation

## 2022-01-15 DIAGNOSIS — D124 Benign neoplasm of descending colon: Secondary | ICD-10-CM | POA: Insufficient documentation

## 2022-01-15 DIAGNOSIS — Z800 Family history of malignant neoplasm of digestive organs: Secondary | ICD-10-CM | POA: Diagnosis not present

## 2022-01-15 DIAGNOSIS — K635 Polyp of colon: Secondary | ICD-10-CM | POA: Diagnosis not present

## 2022-01-15 HISTORY — DX: Unspecified asthma, uncomplicated: J45.909

## 2022-01-15 HISTORY — DX: Type 2 diabetes mellitus without complications: E11.9

## 2022-01-15 HISTORY — PX: COLONOSCOPY WITH PROPOFOL: SHX5780

## 2022-01-15 LAB — GLUCOSE, CAPILLARY: Glucose-Capillary: 130 mg/dL — ABNORMAL HIGH (ref 70–99)

## 2022-01-15 SURGERY — COLONOSCOPY WITH PROPOFOL
Anesthesia: General

## 2022-01-15 MED ORDER — SODIUM CHLORIDE 0.9 % IV SOLN: INTRAVENOUS | Status: DC

## 2022-01-15 MED ORDER — PROPOFOL 10 MG/ML IV BOLUS
INTRAVENOUS | Status: AC
  Filled 2022-01-15: qty 20

## 2022-01-15 MED ORDER — LIDOCAINE HCL (CARDIAC) PF 100 MG/5ML IV SOSY
PREFILLED_SYRINGE | INTRAVENOUS | Status: DC | PRN
  Administered 2022-01-15: 100 mg via INTRAVENOUS

## 2022-01-15 MED ORDER — LABETALOL HCL 5 MG/ML IV SOLN
INTRAVENOUS | Status: DC | PRN
  Administered 2022-01-15: 10 mg via INTRAVENOUS

## 2022-01-15 MED ORDER — PROPOFOL 10 MG/ML IV BOLUS
INTRAVENOUS | Status: DC | PRN
  Administered 2022-01-15: 100 mg via INTRAVENOUS
  Administered 2022-01-15: 100 ug/kg/min via INTRAVENOUS

## 2022-01-15 NOTE — Anesthesia Preprocedure Evaluation (Addendum)
Anesthesia Evaluation  ?Patient identified by MRN, date of birth, ID band ?Patient awake ? ? ? ?Reviewed: ?Allergy & Precautions, NPO status , Patient's Chart, lab work & pertinent test results ? ?Airway ?Mallampati: II ? ?TM Distance: >3 FB ?Neck ROM: full ? ? ? Dental ?no notable dental hx. ? ?  ?Pulmonary ?asthma ,  ?  ?Pulmonary exam normal ? ? ? ? ? ? ? Cardiovascular ?hypertension, Pt. on medications ?Normal cardiovascular exam ? ? ?  ?Neuro/Psych ?PSYCHIATRIC DISORDERS Depression negative neurological ROS ?   ? GI/Hepatic ?negative GI ROS, Neg liver ROS,   ?Endo/Other  ?diabetes, Well Controlled, Type 2, Oral Hypoglycemic Agents ? Renal/GU ?negative Renal ROS  ?negative genitourinary ?  ?Musculoskeletal ? ? Abdominal ?(+) + obese,   ?Peds ? Hematology ?negative hematology ROS ?(+)   ?Anesthesia Other Findings ?Past Medical History: ?No date: Asthma ?No date: Depression ?No date: Diabetes mellitus without complication ([place]) ?No date: Hyperlipidemia ?No date: Hypertension ?No date: Obesity, unspecified ?No date: Other specified menopausal and postmenopausal disorder ? ?Past Surgical History: ?02-22-80: CESAREAN SECTION ?No date: CHOLECYSTECTOMY ?01/09/2016: COLONOSCOPY WITH PROPOFOL; N/A ?    Comment:  Procedure: COLONOSCOPY WITH PROPOFOL;  Surgeon: Billie Ruddy ?             Gustavo Lah, MD;  Location: [facility] ENDOSCOPY;  Service:  ?             Endoscopy;  Laterality: N/A; ?06/01/2016: TOTAL KNEE ARTHROPLASTY; Right ?    Comment:  Procedure: TOTAL KNEE ARTHROPLASTY;  Surgeon: Legrand Como  ?             Rudene Christians, MD;  Location: [facility] ORS;  Service: Orthopedics;   ?             Laterality: Right; ? ? ? ? Reproductive/Obstetrics ?negative OB ROS ? ?  ? ? ? ? ? ? ? ? ? ? ? ? ? ?  ?  ? ? ? ? ? ? ? ?Anesthesia Physical ?Anesthesia Plan ? ?ASA: 2 ? ?Anesthesia Plan: General  ? ?Post-op Pain Management:   ? ?Induction: Intravenous ? ?PONV Risk Score and Plan: Propofol infusion and TIVA ? ?Airway Management  Planned: Natural Airway and Simple Face Mask ? ?Additional Equipment:  ? ?Intra-op Plan:  ? ?Post-operative Plan:  ? ?Informed Consent: I have reviewed the patients History and Physical, chart, labs and discussed the procedure including the risks, benefits and alternatives for the proposed anesthesia with the patient or authorized representative who has indicated his/her understanding and acceptance.  ? ? ? ?Dental advisory given ? ?Plan Discussed with: Anesthesiologist, CRNA and Surgeon ? ?Anesthesia Plan Comments:   ? ? ? ? ? ?Anesthesia Quick Evaluation ? ?

## 2022-01-15 NOTE — Op Note (Signed)
[facility] ?Gastroenterology ?Patient Name: Kathleen Olsen ?Procedure Date: 01/15/2022 9:36 AM ?MRN: [id number] ?Account #: [id number] ?Date of Birth: July 30, 1961 ?Admit Type: Outpatient ?Age: 61 ?Room: [facility] ENDO ROOM 1 ?Gender: Female ?Note Status: Finalized ?Instrument Name: Colonoscope [id number] ?Procedure:             Colonoscopy ?Indications:           High risk colon cancer surveillance: Personal history  ?                       of colonic polyps ?Providers:             Annamaria Helling DO, DO ?Referring MD:          Pleas Koch (Referring MD) ?Medicines:             Monitored Anesthesia Care ?Complications:         No immediate complications. Estimated blood loss:  ?                       Minimal. ?Procedure:             Pre-Anesthesia Assessment: ?                       - Prior to the procedure, a History and Physical was  ?                       performed, and patient medications and allergies were  ?                       reviewed. The patient is competent. The risks and  ?                       benefits of the procedure and the sedation options and  ?                       risks were discussed with the patient. All questions  ?                       were answered and informed consent was obtained.  ?                       Patient identification and proposed procedure were  ?                       verified by the physician, the nurse, the anesthetist  ?                       and the technician in the endoscopy suite. Mental  ?                       Status Examination: alert and oriented. Airway  ?                       Examination: normal oropharyngeal airway and neck  ?                       mobility. Respiratory Examination: clear to  ?  auscultation. CV Examination: RRR, no murmurs, no S3  ?                       or S4. Prophylactic Antibiotics: The patient does not  ?                       require prophylactic antibiotics. Prior  ?                        Anticoagulants: The patient has taken no previous  ?                       anticoagulant or antiplatelet agents. ASA Grade  ?                       Assessment: II - A patient with mild systemic disease.  ?                       After reviewing the risks and benefits, the patient  ?                       was deemed in satisfactory condition to undergo the  ?                       procedure. The anesthesia plan was to use monitored  ?                       anesthesia care (MAC). Immediately prior to  ?                       administration of medications, the patient was  ?                       re-assessed for adequacy to receive sedatives. The  ?                       heart rate, respiratory rate, oxygen saturations,  ?                       blood pressure, adequacy of pulmonary ventilation, and  ?                       response to care were monitored throughout the  ?                       procedure. The physical status of the patient was  ?                       re-assessed after the procedure. ?                       After obtaining informed consent, the colonoscope was  ?                       passed under direct vision. Throughout the procedure,  ?                       the patient's blood pressure, pulse, and oxygen  ?  saturations were monitored continuously. The  ?                       Colonoscope was introduced through the anus and  ?                       advanced to the the terminal ileum, with  ?                       identification of the appendiceal orifice and IC  ?                       valve. The colonoscopy was performed without  ?                       difficulty. The patient tolerated the procedure well.  ?                       The quality of the bowel preparation was evaluated  ?                       using the BBPS [facility] Bowel Preparation Scale) with  ?                       scores of: Right Colon = 3, Transverse Colon = 3 and  ?                       Left Colon = 3  (entire mucosa seen well with no  ?                       residual staining, small fragments of stool or opaque  ?                       liquid). The total BBPS score equals 9. The ileocecal  ?                       valve, appendiceal orifice, and rectum were  ?                       photographed. ?Findings: ?     The perianal and digital rectal examinations were normal. Pertinent  ?     negatives include normal sphincter tone. ?     Multiple small-mouthed diverticula were found in the left colon.  ?     Estimated blood loss: none. ?     Non-bleeding internal hemorrhoids were found during retroflexion. The  ?     hemorrhoids were Grade I (internal hemorrhoids that do not prolapse).  ?     Estimated blood loss: none. ?     Five sessile polyps were found in the rectum, sigmoid colon and  ?     descending colon. The polyps were 1 to 2 mm in size. These polyps were  ?     removed with a cold biopsy forceps. Resection and retrieval were  ?     complete. Estimated blood loss was minimal. ?     Two sessile polyps were found in the ascending colon. The polyps were 3  ?     to 4 mm in size. These polyps were removed with  a cold snare. Resection  ?     and retrieval were complete. Estimated blood loss was minimal. ?     A diffuse area of melanosis was found in the entire colon. Estimated  ?     blood loss: none. ?     The exam was otherwise without abnormality on direct and retroflexion  ?     views. ?     The terminal ileum appeared normal. Estimated blood loss: none. ?Impression:            - Diverticulosis in the left colon. ?                       - Non-bleeding internal hemorrhoids. ?                       - Five 1 to 2 mm polyps in the rectum, in the sigmoid  ?                       colon and in the descending colon, removed with a cold  ?                       biopsy forceps. Resected and retrieved. ?                       - Two 3 to 4 mm polyps in the ascending colon, removed  ?                       with a cold  snare. Resected and retrieved. ?                       - Melanosis in the colon. ?                       - The examination was otherwise normal on direct and  ?                       retroflexion views. ?                       - The examined portion of the ileum was normal. ?Recommendation:        - Discharge patient to home. ?                       - Resume previous diet. ?                       - Continue present medications. ?                       - No aspirin, ibuprofen, naproxen, or other  ?                       non-steroidal anti-inflammatory drugs for 5 days after  ?                       polyp removal. ?                       - Await pathology results. ?                       -  Repeat colonoscopy for surveillance based on  ?                       pathology results. ?                       - Return to referring physician as previously  ?                       scheduled. ?                       - The findings and recommendations were discussed with  ?                       the patient. ?Procedure Code(s):     --- Professional --- ?                       [id number], Colonoscopy, flexible; with removal of  ?                       tumor(s), polyp(s), or other lesion(s) by snare  ?                       technique ?                       45380, 59, Colonoscopy, flexible; with biopsy, single  ?                       or multiple ?Diagnosis Code(s):     --- Professional --- ?                       Z86.010, Personal history of colonic polyps ?                       K64.0, First degree hemorrhoids ?                       K62.1, Rectal polyp ?                       K63.5, Polyp of colon ?                       K63.89, Other specified diseases of intestine ?                       K57.30, Diverticulosis of large intestine without  ?                       perforation or abscess without bleeding ?CPT copyright 2019 [employer]. All rights reserved. ?The codes documented in this report are preliminary and upon  coder review may  ?be revised to meet current compliance requirements. ?Attending Participation: ?     I personally performed the entire procedure. ?Volney American, DO ?Annamaria Helling DO, DO ?01/15/2022 10:40:19

## 2022-01-15 NOTE — Transfer of Care (Signed)
Immediate Anesthesia Transfer of Care Note ? ?Patient: Kathleen Olsen ? ?Procedure(s) Performed: COLONOSCOPY WITH PROPOFOL ? ?Patient Location: PACU ? ?Anesthesia Type:General ? ?Level of Consciousness: awake ? ?Airway & Oxygen Therapy: Patient Spontanous Breathing ? ?Post-op Assessment: Report given to RN and Post -op Vital signs reviewed and stable ? ?Post vital signs: Reviewed and stable ? ?Last Vitals:  ?Vitals Value Taken Time  ?BP 98/45 01/15/22 1040  ?Temp 36   ?Pulse 74 01/15/22 1042  ?Resp 16 01/15/22 1042  ?SpO2 95 % 01/15/22 1042  ?Vitals shown include unvalidated device data. ? ?Last Pain:  ?Vitals:  ? 01/15/22 1030  ?TempSrc: Temporal  ?PainSc:   ?   ? ?  ? ?Complications: No notable events documented. ?

## 2022-01-15 NOTE — Anesthesia Postprocedure Evaluation (Signed)
Anesthesia Post Note ? ?Patient: Kathleen Olsen ? ?Procedure(s) Performed: COLONOSCOPY WITH PROPOFOL ? ?Patient location during evaluation: Endoscopy ?Anesthesia Type: General ?Level of consciousness: awake and alert ?Pain management: pain level controlled ?Vital Signs Assessment: post-procedure vital signs reviewed and stable ?Respiratory status: spontaneous breathing, nonlabored ventilation and respiratory function stable ?Cardiovascular status: blood pressure returned to baseline and stable ?Postop Assessment: no apparent nausea or vomiting ?Anesthetic complications: no ? ? ?No notable events documented. ? ? ?Last Vitals:  ?Vitals:  ? 01/15/22 1050 01/15/22 1100  ?BP: 101/69 (!) 98/57  ?Pulse: 75 69  ?Resp: (!) 24 (!) 23  ?Temp:    ?SpO2: 98% 96%  ?  ?Last Pain:  ?Vitals:  ? 01/15/22 1030  ?TempSrc: Temporal  ?PainSc:   ? ? ?  ?  ?  ?  ?  ?  ? ?[place] Ouch ? ? ? ? ?

## 2022-01-15 NOTE — Interval H&P Note (Signed)
History and Physical Interval Note: Preprocedure H&P from 01/15/22 ? was reviewed and there was no interval change after seeing and examining the patient.  Written consent was obtained from the patient after discussion of risks, benefits, and alternatives. Patient has consented to proceed with Colonoscopy with possible intervention ? ? ?01/15/2022 ?9:40 AM ? ?Kathleen Olsen  has presented today for surgery, with the diagnosis of Hx of polyps.  The various methods of treatment have been discussed with the patient and family. After consideration of risks, benefits and other options for treatment, the patient has consented to  Procedure(s) with comments: ?COLONOSCOPY WITH PROPOFOL (N/A) - DM as a surgical intervention.  The patient's history has been reviewed, patient examined, no change in status, stable for surgery.  I have reviewed the patient's chart and labs.  Questions were answered to the patient's satisfaction.   ? ? ?Annamaria Helling ? ? ?

## 2022-01-18 ENCOUNTER — Encounter: Payer: Self-pay | Admitting: Gastroenterology

## 2022-01-18 LAB — SURGICAL PATHOLOGY

## 2022-01-28 ENCOUNTER — Other Ambulatory Visit: Payer: Self-pay | Admitting: Obstetrics and Gynecology

## 2022-01-28 DIAGNOSIS — Z1231 Encounter for screening mammogram for malignant neoplasm of breast: Secondary | ICD-10-CM

## 2022-02-09 DIAGNOSIS — N898 Other specified noninflammatory disorders of vagina: Secondary | ICD-10-CM | POA: Diagnosis not present

## 2022-02-09 DIAGNOSIS — R102 Pelvic and perineal pain: Secondary | ICD-10-CM | POA: Diagnosis not present

## 2022-02-09 DIAGNOSIS — Z8041 Family history of malignant neoplasm of ovary: Secondary | ICD-10-CM | POA: Diagnosis not present

## 2022-02-09 DIAGNOSIS — N941 Unspecified dyspareunia: Secondary | ICD-10-CM | POA: Diagnosis not present

## 2022-03-02 ENCOUNTER — Ambulatory Visit
Admission: RE | Admit: 2022-03-02 | Discharge: 2022-03-02 | Disposition: A | Discharge status: Home / Self Care | Payer: BC Managed Care – PPO | Source: Ambulatory Visit | Attending: Obstetrics and Gynecology | Admitting: Obstetrics and Gynecology

## 2022-03-02 DIAGNOSIS — Z1231 Encounter for screening mammogram for malignant neoplasm of breast: Secondary | ICD-10-CM | POA: Insufficient documentation

## 2022-03-03 DIAGNOSIS — R0789 Other chest pain: Secondary | ICD-10-CM | POA: Diagnosis not present

## 2022-03-11 DIAGNOSIS — R3915 Urgency of urination: Secondary | ICD-10-CM | POA: Diagnosis not present

## 2022-08-10 DIAGNOSIS — E119 Type 2 diabetes mellitus without complications: Secondary | ICD-10-CM | POA: Diagnosis not present

## 2022-08-10 DIAGNOSIS — E559 Vitamin D deficiency, unspecified: Secondary | ICD-10-CM | POA: Diagnosis not present

## 2022-08-10 DIAGNOSIS — E785 Hyperlipidemia, unspecified: Secondary | ICD-10-CM | POA: Diagnosis not present

## 2022-08-10 DIAGNOSIS — I10 Essential (primary) hypertension: Secondary | ICD-10-CM | POA: Diagnosis not present

## 2023-03-02 ENCOUNTER — Other Ambulatory Visit: Payer: Self-pay | Admitting: Obstetrics and Gynecology

## 2023-03-02 DIAGNOSIS — Z1231 Encounter for screening mammogram for malignant neoplasm of breast: Secondary | ICD-10-CM

## 2023-03-22 ENCOUNTER — Ambulatory Visit
Admission: RE | Admit: 2023-03-22 | Discharge: 2023-03-22 | Disposition: A | Discharge status: Home / Self Care | Payer: BC Managed Care – PPO | Source: Ambulatory Visit | Attending: Obstetrics and Gynecology | Admitting: Obstetrics and Gynecology

## 2023-03-22 DIAGNOSIS — Z1231 Encounter for screening mammogram for malignant neoplasm of breast: Secondary | ICD-10-CM | POA: Insufficient documentation

## 2024-08-07 DIAGNOSIS — R8290 Unspecified abnormal findings in urine: Secondary | ICD-10-CM | POA: Diagnosis not present

## 2024-08-07 DIAGNOSIS — E119 Type 2 diabetes mellitus without complications: Secondary | ICD-10-CM | POA: Diagnosis not present

## 2024-08-14 DIAGNOSIS — Z1331 Encounter for screening for depression: Secondary | ICD-10-CM | POA: Diagnosis not present

## 2024-08-14 DIAGNOSIS — I10 Essential (primary) hypertension: Secondary | ICD-10-CM | POA: Diagnosis not present

## 2024-08-14 DIAGNOSIS — E785 Hyperlipidemia, unspecified: Secondary | ICD-10-CM | POA: Diagnosis not present

## 2024-08-14 DIAGNOSIS — R319 Hematuria, unspecified: Secondary | ICD-10-CM | POA: Diagnosis not present

## 2024-08-14 DIAGNOSIS — Z13820 Encounter for screening for osteoporosis: Secondary | ICD-10-CM | POA: Diagnosis not present

## 2024-08-14 DIAGNOSIS — E669 Obesity, unspecified: Secondary | ICD-10-CM | POA: Diagnosis not present

## 2024-08-14 DIAGNOSIS — Z0000 Encounter for general adult medical examination without abnormal findings: Secondary | ICD-10-CM | POA: Diagnosis not present

## 2024-08-14 DIAGNOSIS — M79671 Pain in right foot: Secondary | ICD-10-CM | POA: Diagnosis not present

## 2024-08-14 DIAGNOSIS — E119 Type 2 diabetes mellitus without complications: Secondary | ICD-10-CM | POA: Diagnosis not present

## 2024-08-16 DIAGNOSIS — Z780 Asymptomatic menopausal state: Secondary | ICD-10-CM | POA: Diagnosis not present
